# Patient Record
Sex: Female | Born: 1970 | Race: Black or African American | Hispanic: No | Marital: Married | Smoking: Never smoker
Health system: Southern US, Community
[De-identification: ages and names within clinical notes are randomized; demographics above are authoritative.]

## PROBLEM LIST (undated history)

## (undated) HISTORY — PX: ABDOMINAL HYSTERECTOMY: SHX81

## (undated) HISTORY — PX: TUBAL LIGATION: SHX77

## (undated) HISTORY — PX: OTHER SURGICAL HISTORY: SHX169

---

## 2004-04-26 ENCOUNTER — Inpatient Hospital Stay (HOSPITAL_COMMUNITY): Admission: AD | Admit: 2004-04-26 | Discharge: 2004-04-26 | Payer: Self-pay | Admitting: *Deleted

## 2004-05-24 ENCOUNTER — Ambulatory Visit: Payer: Self-pay | Admitting: *Deleted

## 2004-05-24 ENCOUNTER — Encounter: Admission: RE | Admit: 2004-05-24 | Discharge: 2004-05-24 | Payer: Self-pay | Admitting: *Deleted

## 2004-06-14 ENCOUNTER — Ambulatory Visit (HOSPITAL_COMMUNITY): Admission: RE | Admit: 2004-06-14 | Discharge: 2004-06-14 | Payer: Self-pay | Admitting: Obstetrics and Gynecology

## 2004-06-14 ENCOUNTER — Encounter: Admission: RE | Admit: 2004-06-14 | Discharge: 2004-06-14 | Payer: Self-pay | Admitting: *Deleted

## 2004-07-05 ENCOUNTER — Encounter: Admission: RE | Admit: 2004-07-05 | Discharge: 2004-07-05 | Payer: Self-pay | Admitting: *Deleted

## 2004-07-07 ENCOUNTER — Ambulatory Visit (HOSPITAL_COMMUNITY): Admission: RE | Admit: 2004-07-07 | Discharge: 2004-07-07 | Payer: Self-pay | Admitting: *Deleted

## 2004-07-19 ENCOUNTER — Encounter: Admission: RE | Admit: 2004-07-19 | Discharge: 2004-07-19 | Payer: Self-pay | Admitting: *Deleted

## 2004-07-20 ENCOUNTER — Ambulatory Visit (HOSPITAL_COMMUNITY): Admission: RE | Admit: 2004-07-20 | Discharge: 2004-07-20 | Payer: Self-pay | Admitting: *Deleted

## 2004-08-02 ENCOUNTER — Encounter: Admission: RE | Admit: 2004-08-02 | Discharge: 2004-08-02 | Payer: Self-pay | Admitting: *Deleted

## 2004-08-02 ENCOUNTER — Ambulatory Visit (HOSPITAL_COMMUNITY): Admission: RE | Admit: 2004-08-02 | Discharge: 2004-08-02 | Payer: Self-pay | Admitting: *Deleted

## 2004-08-12 ENCOUNTER — Ambulatory Visit: Payer: Self-pay | Admitting: Family Medicine

## 2004-08-12 ENCOUNTER — Inpatient Hospital Stay (HOSPITAL_COMMUNITY): Admission: AD | Admit: 2004-08-12 | Discharge: 2004-08-12 | Payer: Self-pay | Admitting: Gynecology

## 2004-08-25 ENCOUNTER — Ambulatory Visit: Payer: Self-pay | Admitting: Family Medicine

## 2004-09-22 ENCOUNTER — Inpatient Hospital Stay (HOSPITAL_COMMUNITY): Admission: AD | Admit: 2004-09-22 | Discharge: 2004-09-22 | Payer: Self-pay | Admitting: Obstetrics & Gynecology

## 2004-09-27 ENCOUNTER — Ambulatory Visit: Payer: Self-pay | Admitting: *Deleted

## 2004-09-29 ENCOUNTER — Inpatient Hospital Stay (HOSPITAL_COMMUNITY): Admission: AD | Admit: 2004-09-29 | Discharge: 2004-10-02 | Payer: Self-pay | Admitting: *Deleted

## 2004-09-29 ENCOUNTER — Ambulatory Visit: Payer: Self-pay | Admitting: Family Medicine

## 2004-10-05 ENCOUNTER — Inpatient Hospital Stay (HOSPITAL_COMMUNITY): Admission: AD | Admit: 2004-10-05 | Discharge: 2004-10-05 | Payer: Self-pay | Admitting: Obstetrics and Gynecology

## 2004-10-10 ENCOUNTER — Inpatient Hospital Stay (HOSPITAL_COMMUNITY): Admission: AD | Admit: 2004-10-10 | Discharge: 2004-10-10 | Payer: Self-pay | Admitting: Obstetrics and Gynecology

## 2005-09-28 IMAGING — US US OB LIMITED
1 series · 14 of 28 positions shown · non-contrast
Comparison: none

CLINICAL DATA: Reevaluate left fetal renal pyelectasis.

[Series 1: us ob limited · 0.33mm/px · 29 acquisitions, 14 frames shown]
[im 2/29]
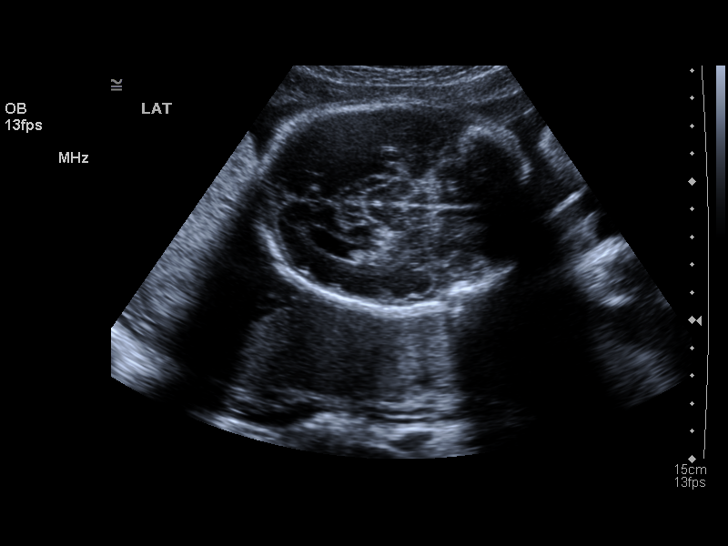
[im 4/29]
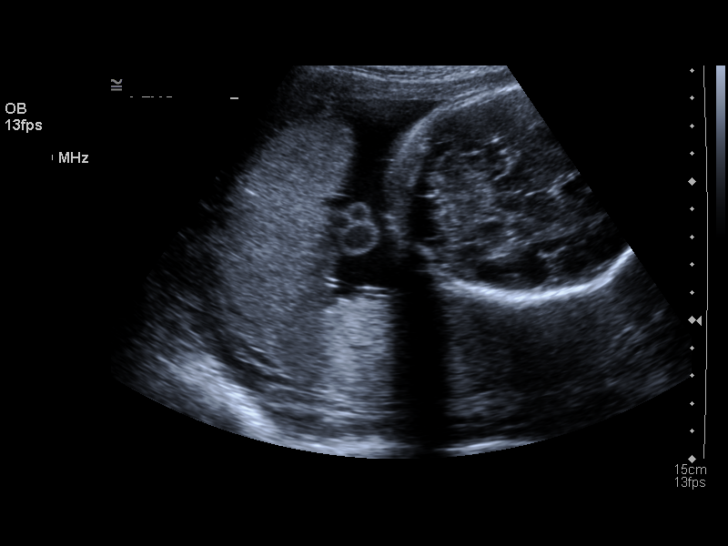
[im 6/29]
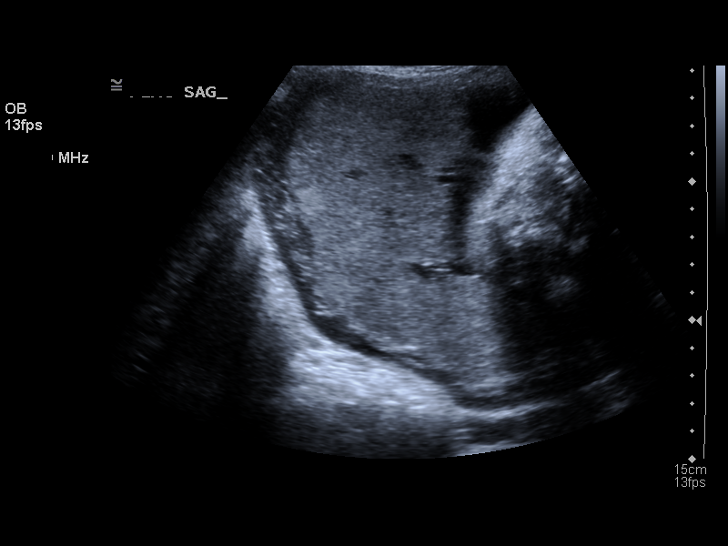
[im 8/29]
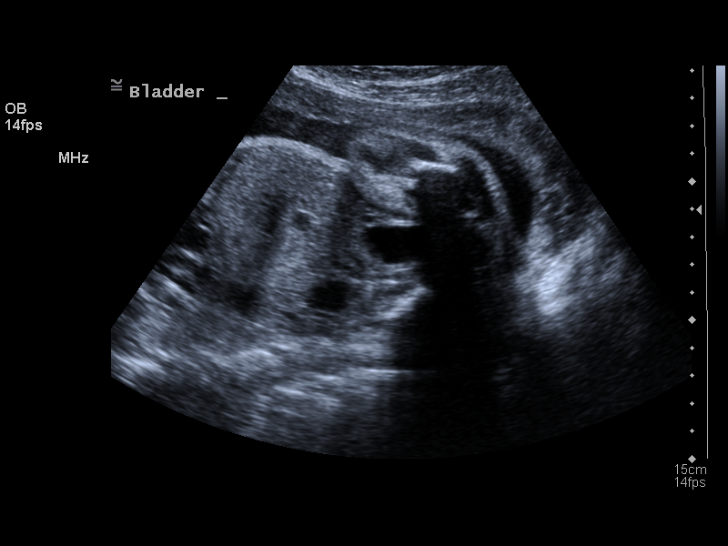
[im 10/29]
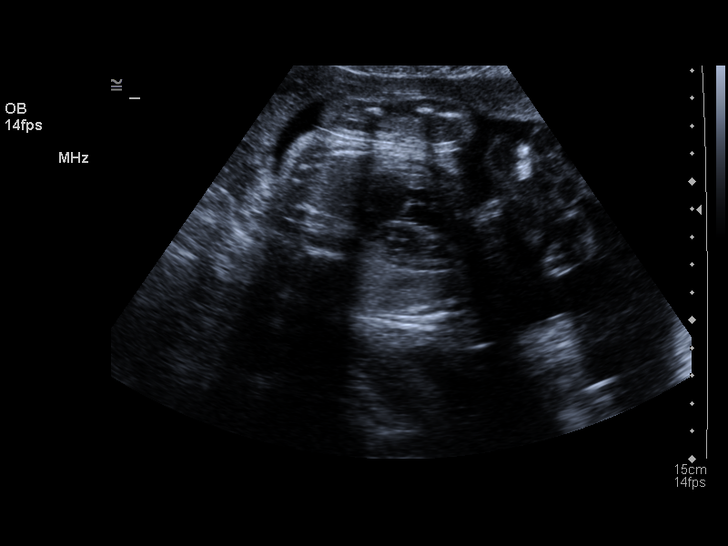
[im 12/29]
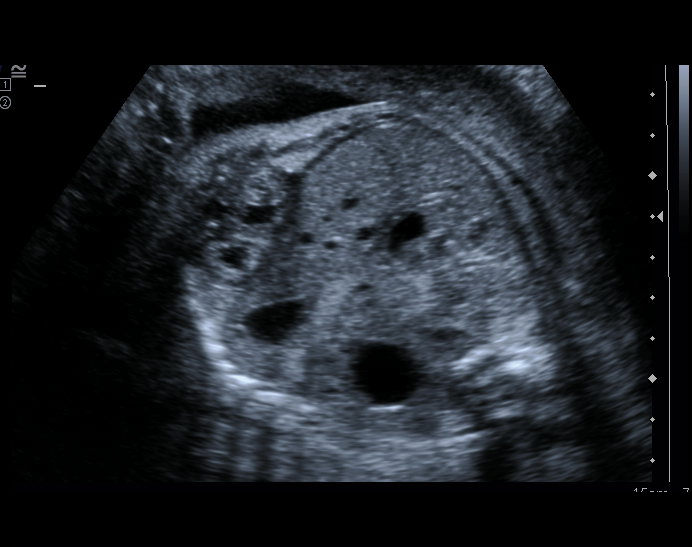
[im 14/29]
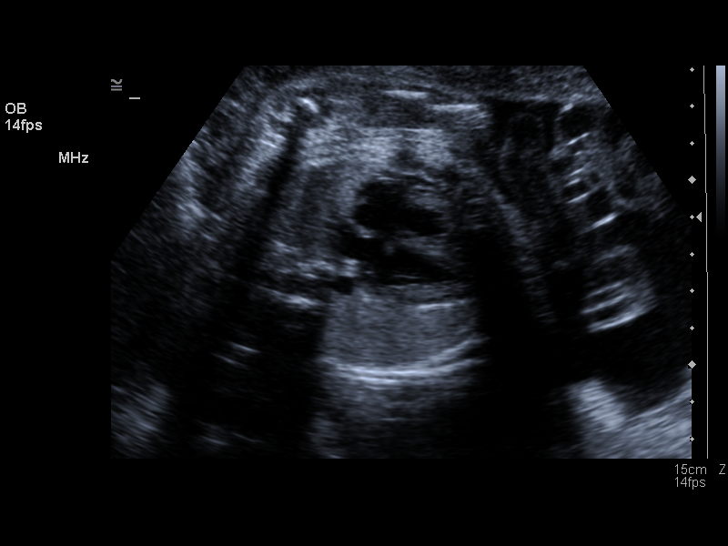
[im 16/29]
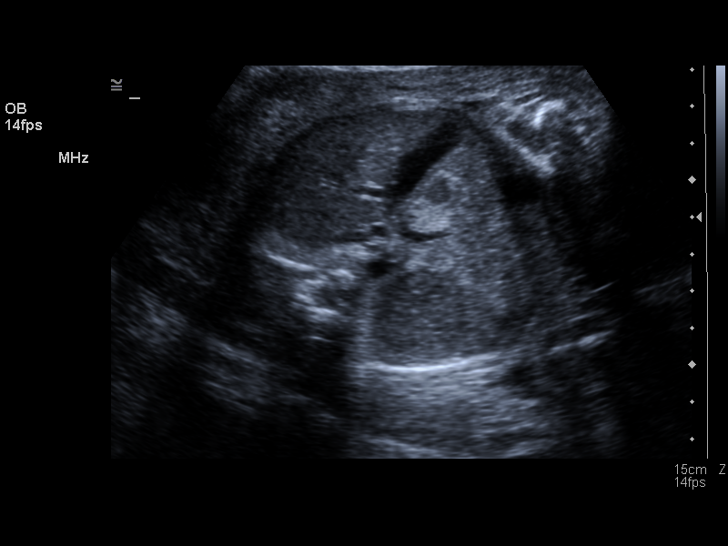
[im 18/29]
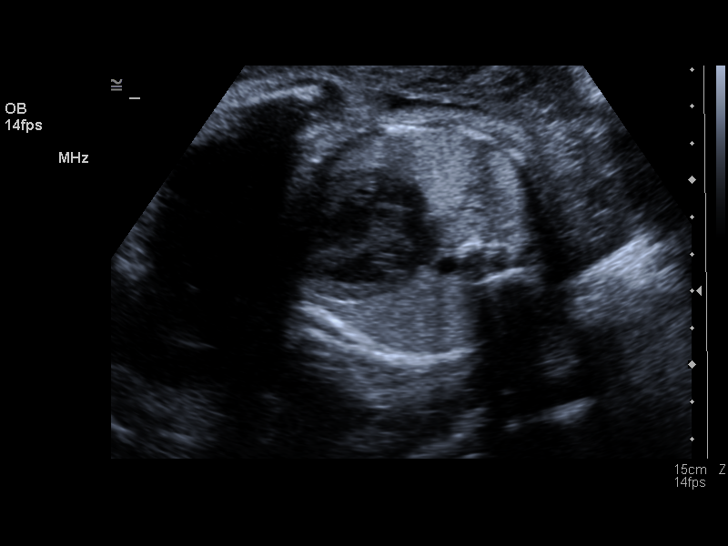
[im 20/29]
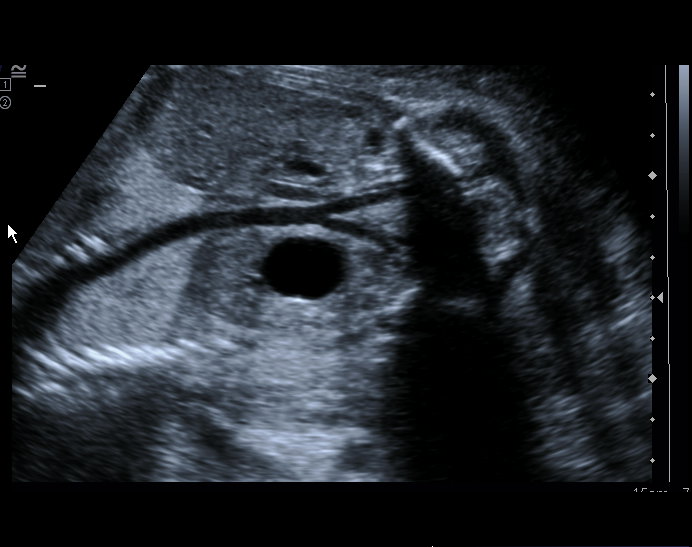
[im 22/29]
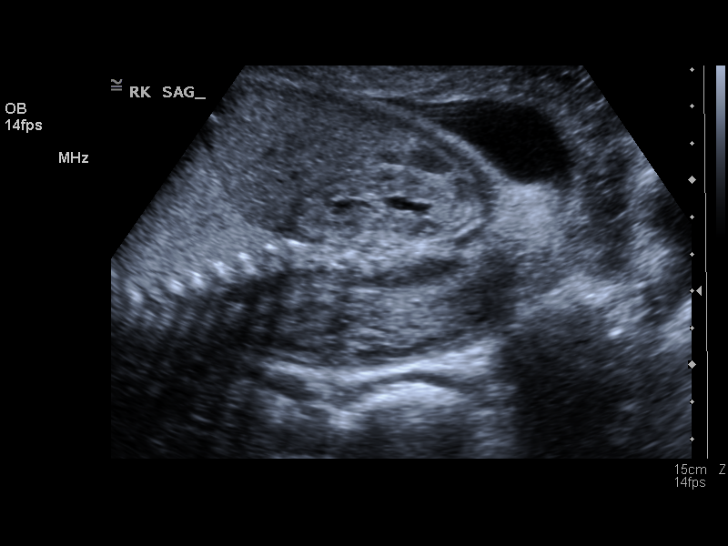
[im 24/29]
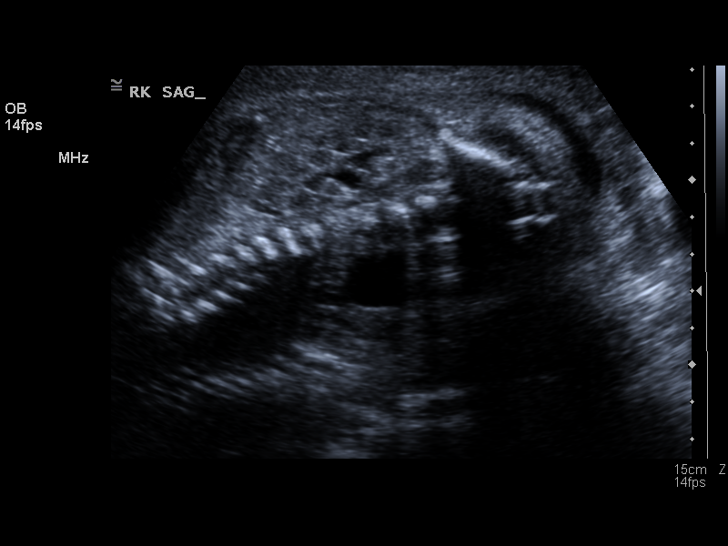
[im 26/29]
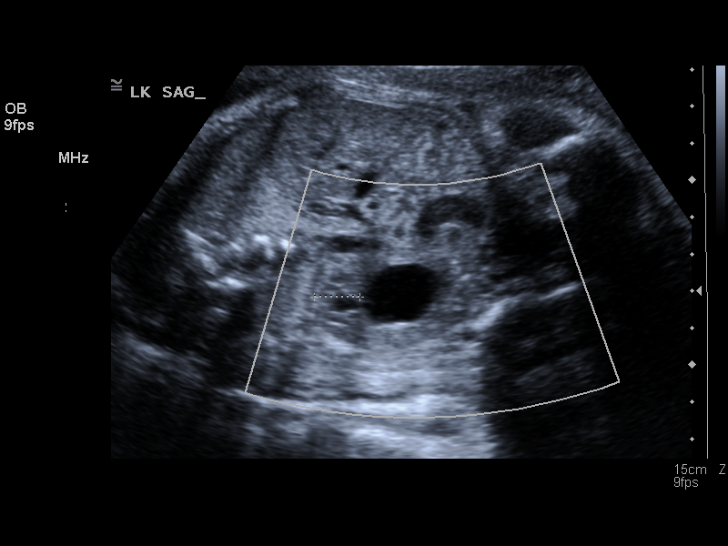
[im 29/29]
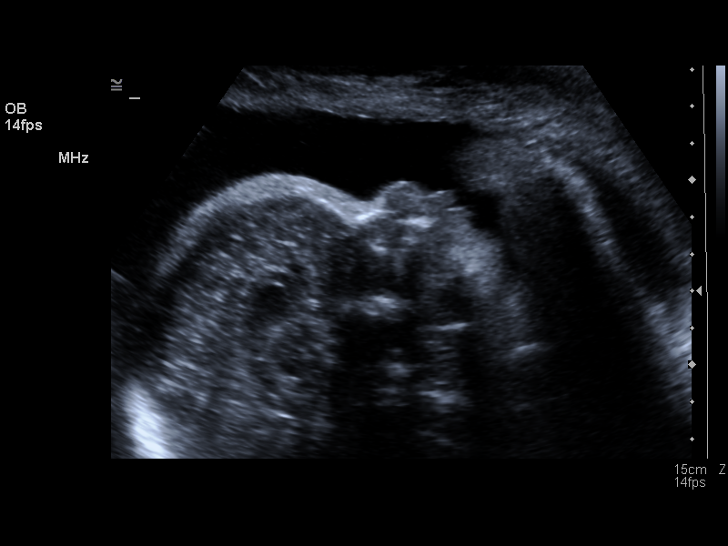

[14 of 28 positions shown; findings below may reference images not displayed]

LIMITED OBSTETRICAL ULTRASOUND
 Number of Fetuses:  1
 Heart Rate:  141
 Movement:  Yes
 Breathing:  Yes
 Presentation:  Transverse with head to maternal right
 Placental Location:  Fundal, anterior 
 Grade:  I
 Previa:  No
 Amniotic Fluid (Subjective):  Normal
 Amniotic Fluid (Objective):  16.2 cm AFI (5th -95th%ile = 9.2 ? 23.1 cm for 29 wks)

 Fetal measurements and complete anatomic evaluation were not requested.  The following fetal anatomy was visualized during this exam:  Lateral ventricles, stomach, kidneys, and bladder.

 MATERNAL FINDINGS
 Cervix:  4.3 cm Transabdominally
IMPRESSION: Single living intrauterine fetus in transverse presentation with subjectively and quantitatively normal amniotic fluid volume.  
 Persistent left fetal renal pyelectasis measuring up to 19 mm in AP diameter for the left renal pelvis  This is stable since the previous exam.  There is evidence of caliectasis on the left.  The right renal pelvis measures within normal limits in AP diameter for a 29 week gestation.  Features remain compatible with a unilateral UPJ obstruction as described in the previous study.

## 2005-11-09 ENCOUNTER — Inpatient Hospital Stay (HOSPITAL_COMMUNITY): Admission: AD | Admit: 2005-11-09 | Discharge: 2005-11-09 | Payer: Self-pay | Admitting: Obstetrics and Gynecology

## 2005-12-29 ENCOUNTER — Ambulatory Visit: Payer: Self-pay | Admitting: Certified Nurse Midwife

## 2005-12-29 ENCOUNTER — Inpatient Hospital Stay (HOSPITAL_COMMUNITY): Admission: AD | Admit: 2005-12-29 | Discharge: 2006-01-03 | Payer: Self-pay | Admitting: *Deleted

## 2006-01-04 ENCOUNTER — Ambulatory Visit: Payer: Self-pay | Admitting: Family Medicine

## 2006-01-04 ENCOUNTER — Inpatient Hospital Stay (HOSPITAL_COMMUNITY): Admission: AD | Admit: 2006-01-04 | Discharge: 2006-01-04 | Payer: Self-pay | Admitting: Obstetrics & Gynecology

## 2006-01-08 ENCOUNTER — Ambulatory Visit (HOSPITAL_COMMUNITY): Admission: RE | Admit: 2006-01-08 | Discharge: 2006-01-08 | Payer: Self-pay | Admitting: Obstetrics & Gynecology

## 2006-01-08 ENCOUNTER — Ambulatory Visit: Payer: Self-pay | Admitting: Obstetrics & Gynecology

## 2006-01-11 ENCOUNTER — Ambulatory Visit: Payer: Self-pay | Admitting: Family Medicine

## 2006-01-11 ENCOUNTER — Inpatient Hospital Stay (HOSPITAL_COMMUNITY): Admission: AD | Admit: 2006-01-11 | Discharge: 2006-01-11 | Payer: Self-pay | Admitting: Family Medicine

## 2006-01-15 ENCOUNTER — Ambulatory Visit: Payer: Self-pay | Admitting: Obstetrics and Gynecology

## 2006-01-15 ENCOUNTER — Ambulatory Visit (HOSPITAL_COMMUNITY): Admission: RE | Admit: 2006-01-15 | Discharge: 2006-01-15 | Payer: Self-pay | Admitting: *Deleted

## 2006-01-18 ENCOUNTER — Ambulatory Visit: Payer: Self-pay | Admitting: Family Medicine

## 2006-01-20 ENCOUNTER — Ambulatory Visit: Payer: Self-pay | Admitting: *Deleted

## 2006-01-20 ENCOUNTER — Inpatient Hospital Stay (HOSPITAL_COMMUNITY): Admission: AD | Admit: 2006-01-20 | Discharge: 2006-01-23 | Payer: Self-pay | Admitting: *Deleted

## 2006-01-20 ENCOUNTER — Encounter (INDEPENDENT_AMBULATORY_CARE_PROVIDER_SITE_OTHER): Payer: Self-pay | Admitting: *Deleted

## 2006-02-15 ENCOUNTER — Ambulatory Visit: Payer: Self-pay | Admitting: Obstetrics and Gynecology

## 2006-06-26 ENCOUNTER — Emergency Department (HOSPITAL_COMMUNITY): Admission: EM | Admit: 2006-06-26 | Discharge: 2006-06-26 | Payer: Self-pay | Admitting: Emergency Medicine

## 2007-03-09 IMAGING — US US OB COMP +14 WK
1 series · 13 of 28 positions shown · non-contrast
Comparison: none

CLINICAL DATA: 26 week 4 day gestational age by LMP.  Heavy vaginal bleeding and pelvic pain.  No prenatal care.

[Series 1: us ob comp +14 wk · 0.39mm/px · 13 of 53 slices shown]
[im 2/53]
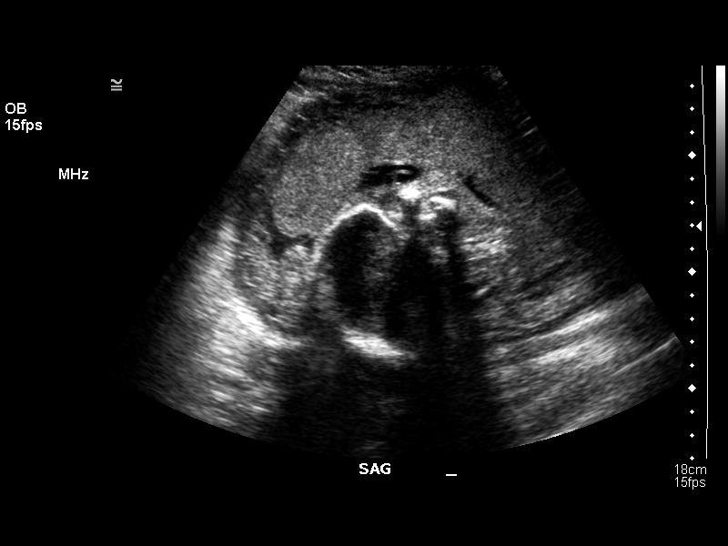
[im 6/53]
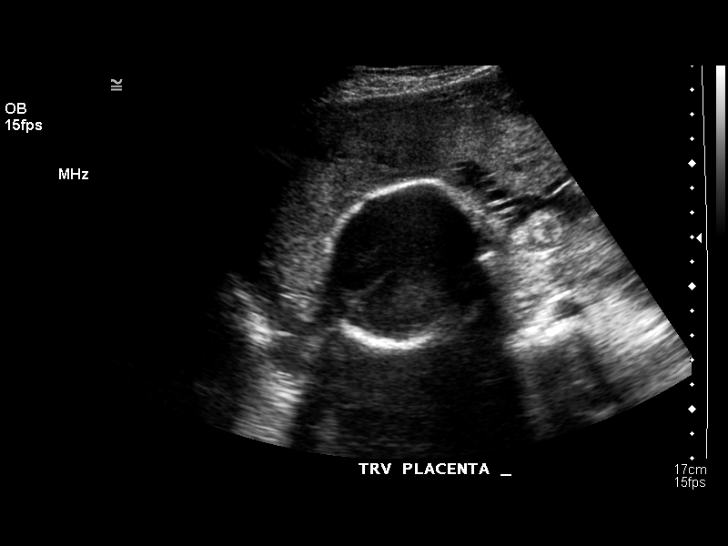
[im 10/53]
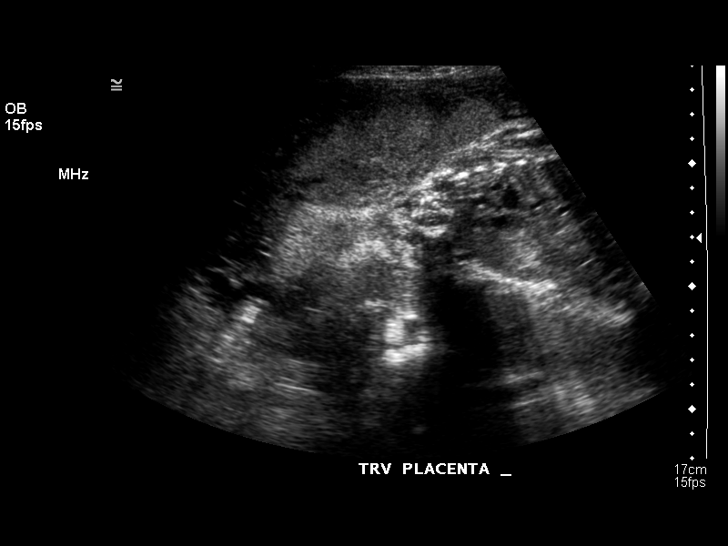
[im 14/53]
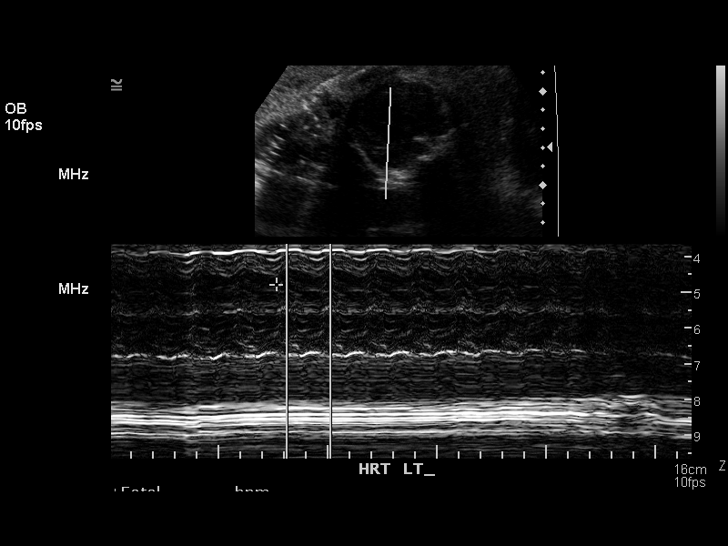
[im 18/53]
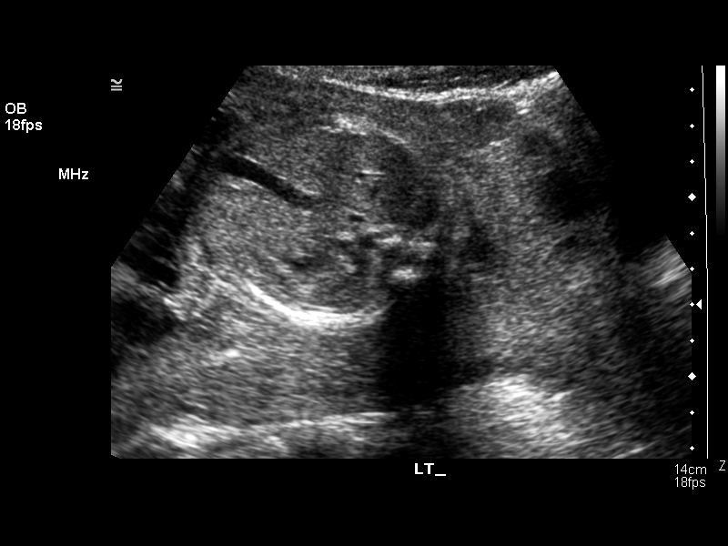
[im 22/53]
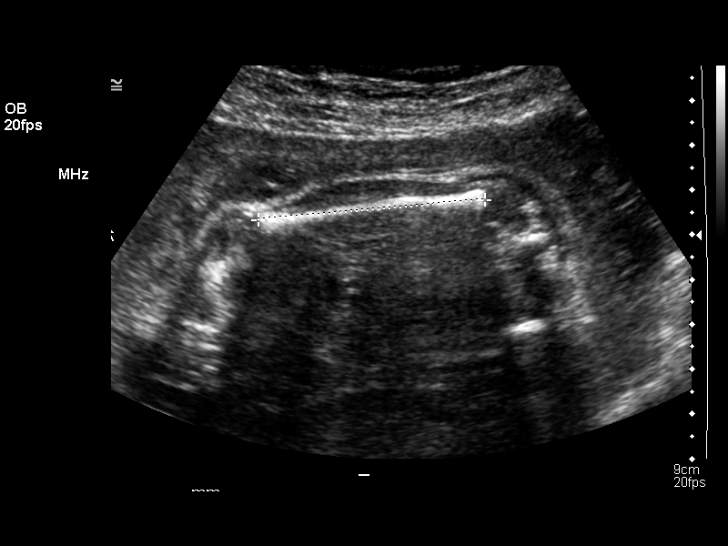
[im 27/53]
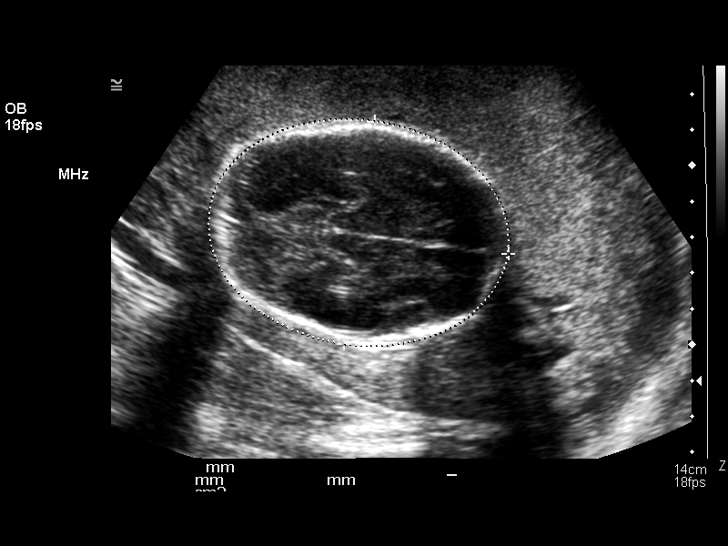
[im 31/53]
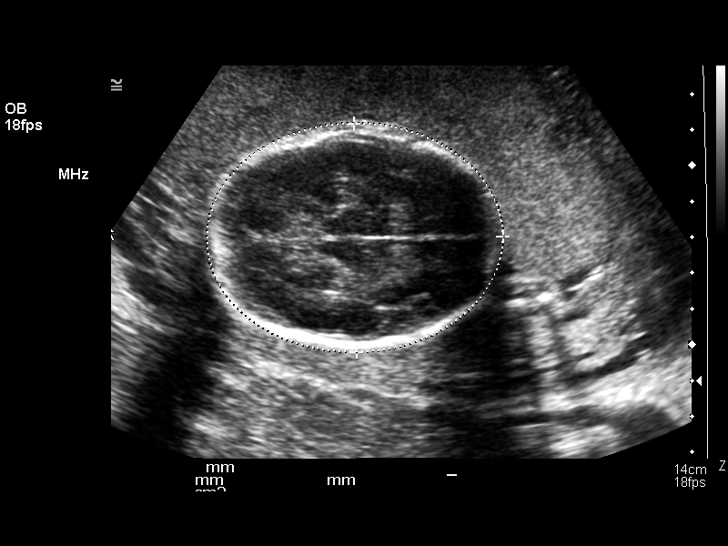
[im 35/53]
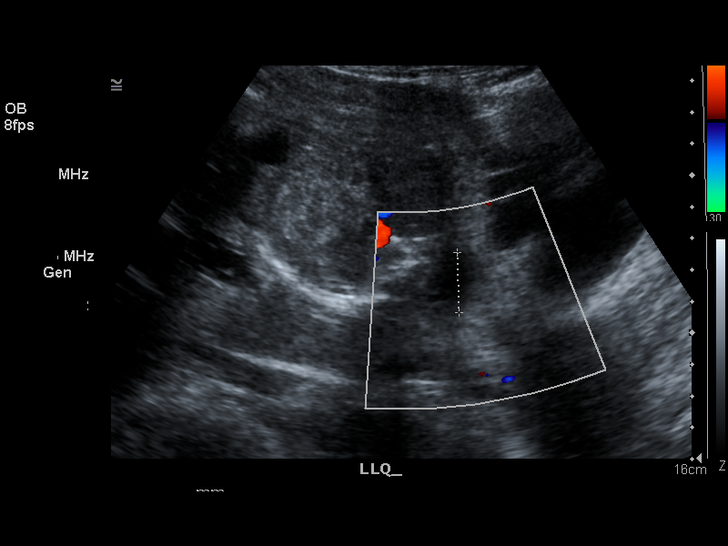
[im 39/53]
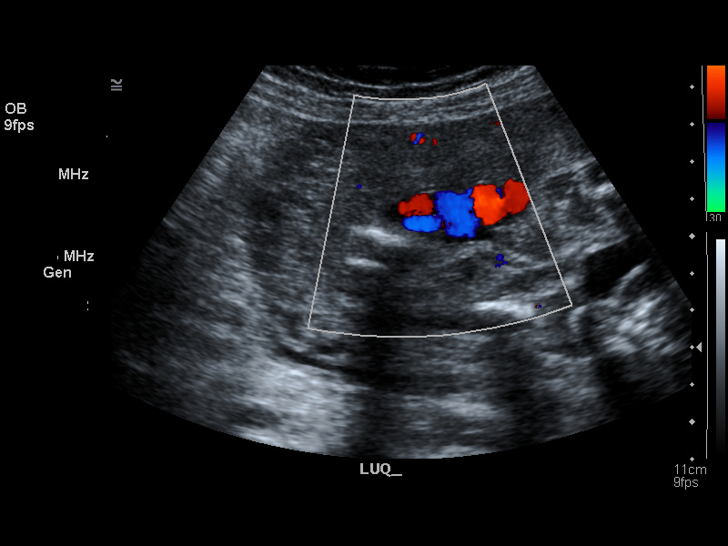
[im 43/53]
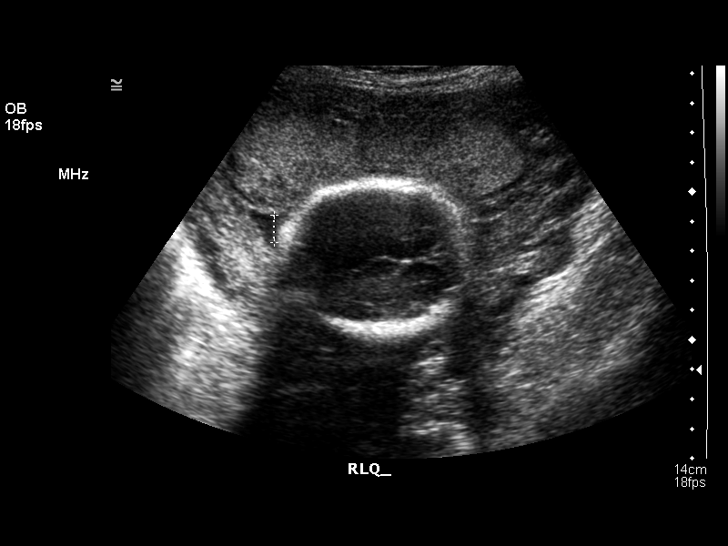
[im 47/53]
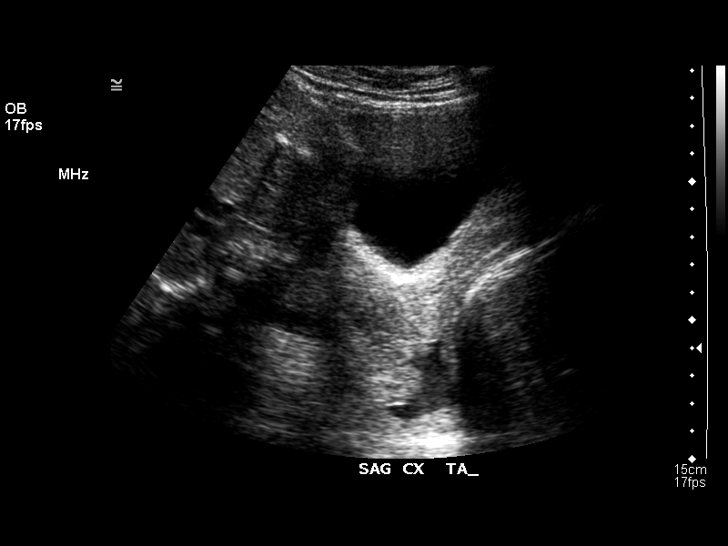
[im 51/53]
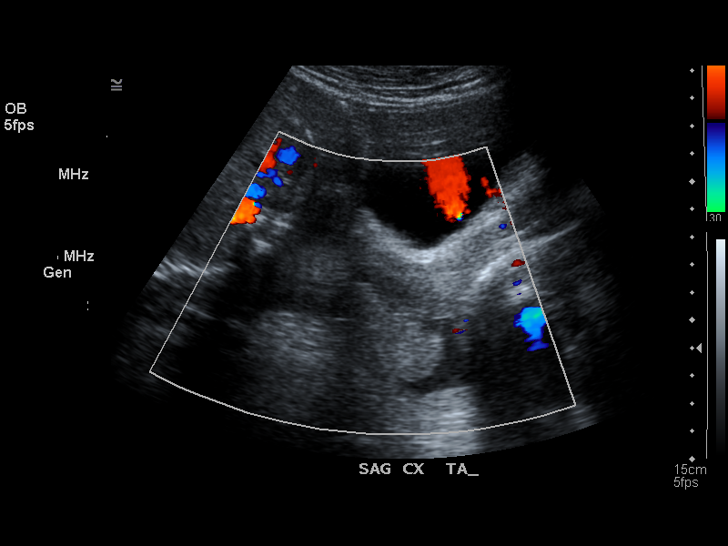

[13 of 28 positions shown; findings below may reference images not displayed]

OBSTETRICAL ULTRASOUND:
 Number of Fetuses:  1
 Heart Rate:  150
 Movement:  Yes
 Breathing:  No  
 Presentation:  Transverse with head to maternal right
 Placental Location:  Anterior
 Grade:  I
 Previa:  No
 Amniotic Fluid (Subjective):  Decreased
 Amniotic Fluid (Objective):   6.1 cm AFI (5th -95th%ile = 9.5 ? 22.6 cm for 27 wks)

 FETAL BIOMETRY
 BPD:   6.0 cm   24 w 5 d
 HC:   23.2 cm   25 w 2 d
 AC:   20.8 cm  25 w 3 d
 FL:    5.0 cm  27 w 1 d

 MEAN GA:  25 w 5 d  US EDC:  04/08/06

 FETAL ANATOMY
 Lateral Ventricles:    Not visualized 
 Thalami/CSP:      Visualized 
 Posterior Fossa:  Not visualized 
 Nuchal Region:    Not visualized 
 Spine:      Not visualized 
 4 Chamber Heart on Left:      Not visualized 
 Stomach on Left:      Visualized 
 3 Vessel Cord:    Not visualized 
 Cord Insertion site:    Not visualized 
 Kidneys:  Visualized 
 Bladder:  Visualized 
 Extremities:      Not visualized 

 ADDITIONAL ANATOMY VISUALIZED:  Orbits.

 Evaluation limited by:  Fetal position and decreased amniotic fluid.

 MATERNAL UTERINE AND ADNEXAL FINDINGS
 Cervix: 4.2 cm Transabdominally
IMPRESSION: 1.  Single living intrauterine fetus with mean gestational age of 25 weeks 5 days and sonographic EDC of 04/08/06.  This is within one week of stated LMP.
 2.  Decreased amniotic fluid volume, with AFI of 6.1 cm.  
 3.  No placental abruption or previa identified.  Limited anatomic evaluation due to decreased amniotic fluid, however no definite fetal abnormality identified.

## 2007-05-12 ENCOUNTER — Emergency Department (HOSPITAL_COMMUNITY): Admission: EM | Admit: 2007-05-12 | Discharge: 2007-05-12 | Payer: Self-pay | Admitting: Emergency Medicine

## 2011-01-15 ENCOUNTER — Encounter: Payer: Self-pay | Admitting: *Deleted

## 2011-05-12 NOTE — Discharge Summary (Signed)
[name], [name]               ACCOUNT NO.:  [id number]   MEDICAL RECORD NO.:  [id number]          PATIENT TYPE:  WOC   LOCATION:  WOC                          FACILITY:  [facility]   PHYSICIAN:  Barth Kirks, M.D.  DATE OF BIRTH:  27-Jan-1971   DATE OF ADMISSION:  01/18/2006  DATE OF DISCHARGE:  01/23/2006                                 DISCHARGE SUMMARY   DISCHARGE DIAGNOSES:  1.  Low-transverse cesarean section of a 29 week and 6/7 days gestation of a      female delivered by cesarean section.  2.  Premature preterm and prolonged rupture of the membranes.  3.  Footling breech presentation of the infant.  4.  Bilateral tubal ligation and desire for permanent sterilization.   DISCHARGE MEDICATIONS:  1.  Percocet 5/325 one tablet by mouth q.6h. for pain.  2.  Ibuprofen 600 mg one tablet q.6h. for pain.  3.  Vistaril 25 mg one tablet every six hours for itching.  4.  Iron sulfate 325 mg one tablet twice daily in between meals for anemia.  5.  Colace 100 mg one tablet daily as a stool softener.   BRIEF HOSPITAL COURSE:  The patient is a 40 year old gravida 8, para 4-0-2-  4, who presented at 84 weeks and 6/7 days best estimated gestational age,  came complaining of pain in back.  She had been having contractions and she  has had a previous prolonged rupture of her membranes with a hospitalization  approximately one month ago.  The patient presented with vaginal bleeding  and with fetal activity.  The patient was noted to have blood in her toilet  and pain in her lower back and she came on in to the MAU.  The patient was  given betamethasone x2 back on her earlier January admission.  She also is  known to have gestational diabetes, as well as a history of pyelonephritis  and depression.  Her obstetrical history is significant for four vaginal  delivery, two D&Cs, one ectopic at 9 weeks and had an abortion, as well as a  tubal ligation then a reversal in 2004 and now with a tubal  ligation again  for the second time.  It was noted in the MAU that the patient had a dilated  cervix with footling breech presentation with legs coming out of the cervix.  The baseline rate was at 145.  Her last  AFI was 1.1.  The patient was taken  back to the OR for emergent C-section.  Please see the dictated OP note on  January 20, 2006 for further details.  The patient delivered a female fetus,  who is currently in the NICU, with Apgars of 4 and 8.  The patient went on  to have a curettage of her uterus, as well as a bitubal ligation.  This was  all performed under spinal anesthesia.  The patient was transported to the  postpartum floor, where she was observed and continued to have a full  recovery status post surgery.  The patient did well, had intake of good oral  solids, was  able to move and her pain was well controlled and her bleeding  was very minimal.  The patient was noted to have a fever on January 29 at  1625 up to 100.5.  The patient was watched for 24 hours and was discharged  home.  The patient is tolerating p.o. well and is now ready for discharge.  The patient will follow up in [facility] in six weeks.  The patient was  noted to have chronic anemia and was sent home on iron twice daily, as well  as itching also known as PUPPS.  The patient was sent home  with Vistaril to take as needed for itching.  The patient has a tubal  ligation for sterilization.  The patient is bottle feeding.  RPR was  nonreactive, hepatitis B surface antigen was negative and rubella was  immune.  Blood type was B positive and antibody negative.      Barth Kirks, M.D.     MB/MEDQ  D:  01/23/2006  T:  01/23/2006  Job:  [id number]

## 2011-05-12 NOTE — Op Note (Signed)
NAMEKATHYANN, [name]               ACCOUNT NO.:  [id number]   MEDICAL RECORD NO.:  [id number]          PATIENT TYPE:  INP   LOCATION:                                [facility]   PHYSICIAN:  Conni Elliot, M.D.DATE OF BIRTH:  October 12, 1971   DATE OF PROCEDURE:  01/20/2006  DATE OF DISCHARGE:  01/23/2006                                 OPERATIVE REPORT   PREOPERATIVE DIAGNOSES:  1.  A 29-week 6-day intrauterine pregnancy.  2.  Active labor.  3.  Prolonged preterm premature rupture of membranes at 26 weeks 5 days of      pregnancy.  4.  Desire for permanent sterilization.  5.  Footling breech.   POSTOPERATIVE DIAGNOSES:  1.  A 29-week 6-day intrauterine pregnancy.  2.  Active labor.  3.  Prolonged preterm premature rupture of membranes at 26 weeks 5 days of      pregnancy.  4.  Desire for permanent sterilization.  5.  Footling breech.   OPERATION/PROCEDURE:  1.  Primary low transverse cesarean section via Pfannenstiel skin incision.  2.  Banjo curettage of uterine cavity.  3.  Bilateral tubal ligation.   SURGEON:  Conni Elliot, M.D.   ASSISTANT:  Marc Morgans. Mayford Knife, M.D.   ANESTHESIA:  Spinal.   COMPLICATIONS:  None.   ESTIMATED BLOOD LOSS:  800.   IV FLUIDS:  2000 mL of lactated Ringer's.   URINARY OUTPUT:  200 mL of clear urine.   INDICATIONS:  The patient is a 40 year old, gravida 4, para 4-0-3-4  at 29  weeks 6 days who presented complaining of contractions and vaginal pressure.  The patient's pregnancy had been complicated by prolonged preterm premature  rupture of membranes on December 29, 2005, when she was only 26 weeks 5 days  pregnant.  She was hospitalized January 5 through January 10 but left AMA.  The had been non-compliant with therapy.  Her last AFI was 1.9 and baby had  been cephalic.  However, on __________ at MAU, exam revealed that the baby  was footling breech and she was dilated to 8 cm and in active labor. The  patient was counseled on  the risks and benefits of surgery and she wanted to  proceed.   FINDINGS:  Female infant in the footling breech presentation with Apgars of 4  and 8.  Normal and ovaries. She does have scarring of bilateral tubes  consistent with her history of having a tubal reversal.   DESCRIPTION OF PROCEDURE:  The patient was taken to the operating room where  spinal anesthesia was placed and found to be adequate.  She was then prepped  and draped in the normal sterile fashion in the dorsal supine position with  a leftward tilt.  A Pfannenstiel skin incision was then made with the  scalpel and carried through to the underlying layer of fascia.  The fascia  was nicked in the midline and the incision extended laterally with the Mayo  scissors.  The superior aspect of the fascial incision was then grasped with  the Kocher clamps, elevated and the underlying  rectus muscles dissected off  bluntly.  The rectus muscles were then separated in the midline and the  peritoneum identified and entered bluntly using the hemostats.  The  peritoneal incision was extended superiorly and inferiorly with good  visualization of the bladder.   The bladder blade was then inserted and the vesicouterine peritoneum  identified, grasped with the pickups and entered sharply with the Metzenbaum  scissors.  The incision was extended laterally and the bladder flap created  digitally.  The bladder blade was then reinserted and the lower uterine  segment incised in a transverse fashion with the scalpel.  The uterine  incision was extended laterally with the bandage scissors.  The bladder  blade was removed and the infant's bottom was delivered, then his legs  followed by his left arm, then right arm, and finally head.  This was all  done atraumatically.  The nose and mouth were bulb-suctioned and the cord  clamped and cut.  The infant was handed off to the waiting neonatologist.  Cord gases were sent.   The placenta was then  removed manually, the uterus exteriorized.  There was  a significant amount of necrotic tissue. We were unable to clear all of it  using a lap and so a banjo curettage was also used.  The placenta was sent.  The uterine incision was repaired with #1 chromic in a running locked  fashion.  A second layer of the same suture was used to imbricate.  There  was excellent hemostasis.  The uterus was replaced and the gutters  irrigated.  The bladder flap was repaired with 2-0 chromic in a running  stitch.  The gutters were cleared of all clot and debris.   Attention was then turned to the fallopian tubes.  The right fallopian tube  was grasped with a Babcock. Then a 2-0 SH chromic was used to ligate a  portion of the right fallopian tube. Then a second piece of the same suture  was used to ligate just inferior to where it was initially ligated.  That  portion of the tube was excised and sent to pathology.  Attention was then  turned to the left side and the same thing was done to the left side.   The peritoneum was closed with 2-0 chromic in a running fashion.  Fascia was  closed with 2-0 plain.  Skin was closed with staples.   The patient tolerated the procedure well.  Sponge, lap and needle counts  were correct x2.  Cefoxitin was given at cord clamp.  The patient was taken  to the recovery room in stable condition.     ______________________________  Marc Morgans Mayford Knife, M.D.    ______________________________  Conni Elliot, M.D.    TLW/MEDQ  D:  01/20/2006  T:  01/21/2006  Job:  [id number]
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
## 2015-05-31 ENCOUNTER — Encounter (HOSPITAL_BASED_OUTPATIENT_CLINIC_OR_DEPARTMENT_OTHER): Payer: Self-pay | Admitting: *Deleted

## 2015-05-31 ENCOUNTER — Emergency Department (HOSPITAL_BASED_OUTPATIENT_CLINIC_OR_DEPARTMENT_OTHER): Payer: Medicaid Other

## 2015-05-31 ENCOUNTER — Emergency Department (HOSPITAL_BASED_OUTPATIENT_CLINIC_OR_DEPARTMENT_OTHER)
Admission: EM | Admit: 2015-05-31 | Discharge: 2015-05-31 | Disposition: A | Discharge status: Home / Self Care | Payer: Medicaid Other | Attending: Emergency Medicine | Admitting: Emergency Medicine

## 2015-05-31 DIAGNOSIS — R1031 Right lower quadrant pain: Secondary | ICD-10-CM | POA: Insufficient documentation

## 2015-05-31 DIAGNOSIS — R1110 Vomiting, unspecified: Secondary | ICD-10-CM | POA: Diagnosis not present

## 2015-05-31 DIAGNOSIS — R109 Unspecified abdominal pain: Secondary | ICD-10-CM

## 2015-05-31 DIAGNOSIS — Z9851 Tubal ligation status: Secondary | ICD-10-CM | POA: Insufficient documentation

## 2015-05-31 DIAGNOSIS — N949 Unspecified condition associated with female genital organs and menstrual cycle: Secondary | ICD-10-CM

## 2015-05-31 DIAGNOSIS — Z9889 Other specified postprocedural states: Secondary | ICD-10-CM | POA: Diagnosis not present

## 2015-05-31 DIAGNOSIS — Z3202 Encounter for pregnancy test, result negative: Secondary | ICD-10-CM | POA: Insufficient documentation

## 2015-05-31 LAB — URINALYSIS, ROUTINE W REFLEX MICROSCOPIC
BILIRUBIN URINE: NEGATIVE
Glucose, UA: NEGATIVE mg/dL
KETONES UR: NEGATIVE mg/dL
NITRITE: NEGATIVE
PROTEIN: NEGATIVE mg/dL
Specific Gravity, Urine: 1.021 (ref 1.005–1.030)
Urobilinogen, UA: 1 mg/dL (ref 0.0–1.0)
pH: 7.5 (ref 5.0–8.0)

## 2015-05-31 LAB — URINE MICROSCOPIC-ADD ON

## 2015-05-31 LAB — COMPREHENSIVE METABOLIC PANEL
ALT: 11 U/L — ABNORMAL LOW (ref 14–54)
AST: 14 U/L — ABNORMAL LOW (ref 15–41)
Albumin: 4.1 g/dL (ref 3.5–5.0)
Alkaline Phosphatase: 49 U/L (ref 38–126)
Anion gap: 8 (ref 5–15)
BUN: 6 mg/dL (ref 6–20)
CALCIUM: 9.9 mg/dL (ref 8.9–10.3)
CO2: 21 mmol/L — AB (ref 22–32)
Chloride: 110 mmol/L (ref 101–111)
Creatinine, Ser: 0.54 mg/dL (ref 0.44–1.00)
GFR calc non Af Amer: >60 mL/min (ref >60)
GLUCOSE: 103 mg/dL — AB (ref 65–99)
Potassium: 3.7 mmol/L (ref 3.5–5.1)
Sodium: 139 mmol/L (ref 135–145)
Total Bilirubin: 0.5 mg/dL (ref 0.3–1.2)
Total Protein: 7.5 g/dL (ref 6.5–8.1)

## 2015-05-31 LAB — CBC WITH DIFFERENTIAL/PLATELET
Basophils Absolute: 0 10*3/uL (ref 0.0–0.1)
Basophils Relative: 0 % (ref 0–1)
EOS PCT: 1 % (ref 0–5)
Eosinophils Absolute: 0.1 10*3/uL (ref 0.0–0.7)
HEMATOCRIT: 41 % (ref 36.0–46.0)
Hemoglobin: 13.7 g/dL (ref 12.0–15.0)
LYMPHS ABS: 1.2 10*3/uL (ref 0.7–4.0)
Lymphocytes Relative: 10 % — ABNORMAL LOW (ref 12–46)
MCH: 29.3 pg (ref 26.0–34.0)
MCHC: 33.4 g/dL (ref 30.0–36.0)
MCV: 87.8 fL (ref 78.0–100.0)
MONO ABS: 0.6 10*3/uL (ref 0.1–1.0)
Monocytes Relative: 4 % (ref 3–12)
NEUTROS PCT: 85 % — AB (ref 43–77)
Neutro Abs: 10.7 10*3/uL — ABNORMAL HIGH (ref 1.7–7.7)
Platelets: 229 10*3/uL (ref 150–400)
RBC: 4.67 MIL/uL (ref 3.87–5.11)
RDW: 13.7 % (ref 11.5–15.5)
WBC: 12.6 10*3/uL — ABNORMAL HIGH (ref 4.0–10.5)

## 2015-05-31 LAB — LIPASE, BLOOD: LIPASE: 12 U/L — AB (ref 22–51)

## 2015-05-31 LAB — PREGNANCY, URINE: PREG TEST UR: NEGATIVE

## 2015-05-31 MED ORDER — ONDANSETRON HCL 4 MG PO TABS: 4.0000 mg | ORAL_TABLET | Freq: Three times a day (TID) | ORAL | Status: DC | PRN

## 2015-05-31 MED ORDER — MORPHINE SULFATE 4 MG/ML IJ SOLN
4.0000 mg | Freq: Once | INTRAMUSCULAR | Status: AC
  Administered 2015-05-31: 4 mg via INTRAVENOUS
  Filled 2015-05-31: qty 1

## 2015-05-31 MED ORDER — LIDOCAINE HCL (PF) 1 % IJ SOLN
INTRAMUSCULAR | Status: AC
  Administered 2015-05-31: 5 mL
  Filled 2015-05-31: qty 5

## 2015-05-31 MED ORDER — DOXYCYCLINE HYCLATE 50 MG PO CAPS: 100.0000 mg | ORAL_CAPSULE | Freq: Two times a day (BID) | ORAL | Status: DC

## 2015-05-31 MED ORDER — MORPHINE SULFATE 2 MG/ML IJ SOLN
2.0000 mg | Freq: Once | INTRAMUSCULAR | Status: AC
  Administered 2015-05-31: 2 mg via INTRAVENOUS
  Filled 2015-05-31: qty 1

## 2015-05-31 MED ORDER — CEFTRIAXONE SODIUM 250 MG IJ SOLR
250.0000 mg | Freq: Once | INTRAMUSCULAR | Status: AC
  Administered 2015-05-31: 250 mg via INTRAMUSCULAR
  Filled 2015-05-31: qty 250

## 2015-05-31 MED ORDER — HYDROCODONE-ACETAMINOPHEN 5-325 MG PO TABS: 1.0000 | ORAL_TABLET | Freq: Four times a day (QID) | ORAL | Status: DC | PRN

## 2015-05-31 MED ORDER — METRONIDAZOLE 500 MG PO TABS: 500.0000 mg | ORAL_TABLET | Freq: Two times a day (BID) | ORAL | Status: DC

## 2015-05-31 NOTE — ED Notes (Signed)
Abdominal pain since last night. Hx of same pain when she was diagnosed with trichomonas.

## 2015-05-31 NOTE — ED Notes (Signed)
Per Seward GraterMaggie, lab tech, previous specimens collected need to be recollected due to "clot" in the specimen.

## 2015-05-31 NOTE — Discharge Instructions (Signed)

## 2015-05-31 NOTE — ED Notes (Addendum)
Pt resting quietly in nad. Denies any c/o or needs, talking with family at bedside.

## 2015-05-31 NOTE — ED Provider Notes (Signed)
CSN: [id number]     Arrival date & time 05/31/15  1127 History   First MD Initiated Contact with Patient 05/31/15 1212     Chief Complaint  Patient presents with  . Abdominal Pain   RLQ abdominal pain that started last night. Sharp in nature and 9-10/10.  No radiation and feels like contractions.  The pain is constant and getting worse. She had one episode of vomiting last night.  It is similar to the pain she had when she had trichomonas last year. She never completed the ABX for that treatment. It is possible that she has an STD as her an her partner never completed the treatment and continue to have unprotected intercourse.  Pain is worse with movement.  She took some tylenol with codeine and that helped the pain some. Last bowel movement was this morning and normal.  Pain is not related to eating.  History of c-section and tubal ligation in 2007 and an ablation in 2015.    (Consider location/radiation/quality/duration/timing/severity/associated sxs/prior Treatment) Patient is a 44 y.o. female presenting with abdominal pain. The history is provided by the patient.  Abdominal Pain Pain location:  RLQ Pain quality: sharp   Pain radiates to:  Does not radiate Pain severity:  Severe Onset quality:  Sudden Timing:  Constant Progression:  Worsening Chronicity:  New Context: alcohol use   Worsened by:  Movement Associated symptoms: vomiting   Associated symptoms: no chest pain, no chills, no constipation, no cough, no diarrhea, no dysuria, no fever, no nausea and no shortness of breath     History reviewed. No pertinent past medical history. Past Surgical History  Procedure Laterality Date  . Tubal ligation    . Cesarean section    . Ablasion     No family history on file. History  Substance Use Topics  . Smoking status: Never Smoker   . Smokeless tobacco: Not on file  . Alcohol Use: No   OB History    No data available     Review of Systems  Constitutional: Negative for  fever, chills and appetite change.  HENT: Negative for trouble swallowing.   Respiratory: Negative for cough, choking and shortness of breath.   Cardiovascular: Negative for chest pain.  Gastrointestinal: Positive for vomiting and abdominal pain. Negative for nausea, diarrhea, constipation and blood in stool.  Genitourinary: Negative for dysuria and urgency.  Musculoskeletal: Negative for back pain and arthralgias.  Skin: Negative for color change and rash.  Psychiatric/Behavioral: Negative for confusion.      Allergies  Review of patient's allergies indicates no known allergies.  Home Medications   Prior to Admission medications   Not on File   BP 131/65 mmHg  Pulse 64  Temp(Src) 98.2 F (36.8 C) (Oral)  Resp 20  Ht  (1.702 m)  Wt 165 lb (74.844 kg)  BMI 25.84 kg/m2  SpO2 98%  LMP 05/31/2015 (Exact Date) Physical Exam  Constitutional: She is oriented to person, place, and time. She appears well-developed and well-nourished.  HENT:  Head: Normocephalic and atraumatic.  Eyes: Conjunctivae and EOM are normal. Pupils are equal, round, and reactive to light.  Neck: Normal range of motion. Neck supple. No thyromegaly present.  Cardiovascular: Normal rate, regular rhythm, normal heart sounds and intact distal pulses.   No murmur heard. Pulmonary/Chest: Effort normal. She has no wheezes. She has no rales.  Abdominal: Soft. Bowel sounds are normal. She exhibits no distension. There is tenderness. There is no rebound and no guarding.  Genitourinary: Vagina normal.  No external genitalia  Unable to visualize cervix Cervical motion tenderness Tenderness of the right adnexa with palpation    Musculoskeletal: She exhibits no edema.  Lymphadenopathy:    She has no cervical adenopathy.  Neurological: She is alert and oriented to person, place, and time.  Skin: Skin is warm. No rash noted. No erythema.    ED Course  Procedures (including critical care time) Labs  Review Labs Reviewed  URINALYSIS, ROUTINE W REFLEX MICROSCOPIC (NOT AT [facility]) - Abnormal; Notable for the following:    APPearance TURBID (*)    Hgb urine dipstick SMALL (*)    Leukocytes, UA SMALL (*)    All other components within normal limits  URINE MICROSCOPIC-ADD ON - Abnormal; Notable for the following:    Bacteria, UA MANY (*)    All other components within normal limits  CBC WITH DIFFERENTIAL/PLATELET - Abnormal; Notable for the following:    WBC 12.6 (*)    Neutrophils Relative % 85 (*)    Neutro Abs 10.7 (*)    Lymphocytes Relative 10 (*)    All other components within normal limits  LIPASE, BLOOD - Abnormal; Notable for the following:    Lipase 12 (*)    All other components within normal limits  COMPREHENSIVE METABOLIC PANEL - Abnormal; Notable for the following:    CO2 21 (*)    Glucose, Bld 103 (*)    AST 14 (*)    ALT 11 (*)    All other components within normal limits  URINE CULTURE  PREGNANCY, URINE  CBC WITH DIFFERENTIAL/PLATELET  HIV ANTIBODY (ROUTINE TESTING)  RPR  URINE CYTOLOGY ANCILLARY ONLY    Imaging Review US Transvaginal Non-ob  05/31/2015   CLINICAL DATA:  44 year old female with right pelvic pain for 1 day. Patient began menstrual cycle today. History of previous C-section and bilateral tubal ligation.  EXAM: TRANSABDOMINAL AND TRANSVAGINAL ULTRASOUND OF PELVIS  TECHNIQUE: Both transabdominal and transvaginal ultrasound examinations of the pelvis were performed. Transabdominal technique was performed for global imaging of the pelvis including uterus, ovaries, adnexal regions, and pelvic cul-de-sac. It was necessary to proceed with endovaginal exam following the transabdominal exam to visualize the endometrium and adnexal regions.  COMPARISON:  02/14/2011 and prior exams.  FINDINGS: Uterus  Measurements: Anteverted measuring 9.7 x 4.8 x 6 cm. No fibroids or other mass visualized. A septate uterus is noted.  Endometrium  Thickness: 4 mm. Complex fluid/  blood within the endometrial canal identified.  Right ovary  Measurements: Unremarkable measuring 2.9 x 1.7 x 2.5 cm. Normal appearance/no adnexal mass.  Left ovary  Measurements: Unremarkable measuring 3 x 2.5 x 2.1 cm. Normal appearance/no adnexal mass.  Other findings  No free fluid.  IMPRESSION: Complex fluid/ blood within the endometrial canal likely representing beginning of menses.  Septate uterus.  Normal ovaries.   Electronically Signed   By: Harmon Pier M.D.   On: 05/31/2015 15:52   US Pelvis Complete  05/31/2015   CLINICAL DATA:  44 year old female with right pelvic pain for 1 day. Patient began menstrual cycle today. History of previous C-section and bilateral tubal ligation.  EXAM: TRANSABDOMINAL AND TRANSVAGINAL ULTRASOUND OF PELVIS  TECHNIQUE: Both transabdominal and transvaginal ultrasound examinations of the pelvis were performed. Transabdominal technique was performed for global imaging of the pelvis including uterus, ovaries, adnexal regions, and pelvic cul-de-sac. It was necessary to proceed with endovaginal exam following the transabdominal exam to visualize the endometrium and adnexal regions.  COMPARISON:  02/14/2011  and prior exams.  FINDINGS: Uterus  Measurements: Anteverted measuring 9.7 x 4.8 x 6 cm. No fibroids or other mass visualized. A septate uterus is noted.  Endometrium  Thickness: 4 mm. Complex fluid/ blood within the endometrial canal identified.  Right ovary  Measurements: Unremarkable measuring 2.9 x 1.7 x 2.5 cm. Normal appearance/no adnexal mass.  Left ovary  Measurements: Unremarkable measuring 3 x 2.5 x 2.1 cm. Normal appearance/no adnexal mass.  Other findings  No free fluid.  IMPRESSION: Complex fluid/ blood within the endometrial canal likely representing beginning of menses.  Septate uterus.  Normal ovaries.   Electronically Signed   By: Harmon PierJeffrey  Hu M.D.   On: 05/31/2015 15:52     EKG Interpretation None     Medications  cefTRIAXone (ROCEPHIN) injection 250 mg  (not administered)  morphine 2 MG/ML injection 2 mg (2 mg Intravenous Given 05/31/15 1247)  morphine 4 MG/ML injection 4 mg (4 mg Intravenous Given 05/31/15 1342)  morphine 4 MG/ML injection 4 mg (4 mg Intravenous Given 05/31/15 1538)    MDM   Final diagnoses:  Cervical motion tenderness  Abdominal pain    Abdominal pain in the RLQ. Urine pregnancy negative. Doesn't appear to be appendicitis. May be ovarian in nature.  Order CMP, lipase, HIV, RPR, urine cytology and wet prep.   UA showing small leukocytes. Urine culture ordered. Unable to visualize cervix but tenderness with motion and adnexal pain with palpation upon palpation. Transvaginal US ordered. US not revealing.   Could be related to her mentration but will treat to cover for PID as she has a history of trich with incomplete treatment. CTX 250 mg IM, doxycycline 10 mg BID for 14 days and flagyl 500 mg BID for 14 days. Give norco for pain PRN and zofran for nausea.     Myra RudeJeremy E Schmitz, MD PGY-2, [facility] 05/31/2015, 4:15 PM       Myra RudeJeremy E Schmitz, MD 05/31/15 1615  Toy CookeyMegan Docherty, MD 06/01/15 [phone]

## 2015-05-31 NOTE — ED Notes (Signed)
Pt states "I just started my period, full on!" Pt given feminine napkin per request, amb to bathroom with s/b@.

## 2015-06-01 LAB — URINE CULTURE: Colony Count: >=100000

## 2015-06-01 LAB — RPR: RPR Ser Ql: NONREACTIVE

## 2015-06-01 LAB — HIV ANTIBODY (ROUTINE TESTING W REFLEX): HIV SCREEN 4TH GENERATION: NONREACTIVE

## 2015-06-18 ENCOUNTER — Emergency Department (HOSPITAL_BASED_OUTPATIENT_CLINIC_OR_DEPARTMENT_OTHER)
Admission: EM | Admit: 2015-06-18 | Discharge: 2015-06-18 | Disposition: A | Discharge status: Home / Self Care | Payer: Medicaid Other | Attending: Emergency Medicine | Admitting: Emergency Medicine

## 2015-06-18 ENCOUNTER — Encounter (HOSPITAL_BASED_OUTPATIENT_CLINIC_OR_DEPARTMENT_OTHER): Payer: Self-pay | Admitting: Emergency Medicine

## 2015-06-18 DIAGNOSIS — H6123 Impacted cerumen, bilateral: Secondary | ICD-10-CM | POA: Diagnosis not present

## 2015-06-18 DIAGNOSIS — Z792 Long term (current) use of antibiotics: Secondary | ICD-10-CM | POA: Diagnosis not present

## 2015-06-18 DIAGNOSIS — H6121 Impacted cerumen, right ear: Secondary | ICD-10-CM

## 2015-06-18 DIAGNOSIS — H938X1 Other specified disorders of right ear: Secondary | ICD-10-CM | POA: Diagnosis present

## 2015-06-18 MED ORDER — DOCUSATE SODIUM 50 MG/5ML PO LIQD
50.0000 mg | Freq: Once | ORAL | Status: AC
  Administered 2015-06-18: 50 mg via ORAL
  Filled 2015-06-18: qty 10

## 2015-06-18 NOTE — ED Provider Notes (Signed)
CSN: [id number]     Arrival date & time 06/18/15  1254 History   First MD Initiated Contact with Patient 06/18/15 1312     Chief Complaint  Patient presents with  . Cerumen Impaction     (Consider location/radiation/quality/duration/timing/severity/associated sxs/prior Treatment) The history is provided by the patient and medical records.   This is a 44 y.o. F with hx of tubal ligation, presenting to the ED for decreased hearing from her right eat x 2 days.  Patient states her ear feels "clogged".  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  She denies ear pain or drainage.  No problems with left ear.  History reviewed. No pertinent past medical history. Past Surgical History  Procedure Laterality Date  . Tubal ligation    . Cesarean section    . Ablasion     History reviewed. No pertinent family history. History  Substance Use Topics  . Smoking status: Never Smoker   . Smokeless tobacco: Not on file  . Alcohol Use: No   OB History    No data available     Review of Systems  HENT:       Cerumen impaction  All other systems reviewed and are negative.     Allergies  Review of patient's allergies indicates no known allergies.  Home Medications   Prior to Admission medications   Medication Sig Start Date End Date Taking? Authorizing Provider  doxycycline (VIBRAMYCIN) 50 MG capsule Take 2 capsules (100 mg total) by mouth 2 (two) times daily. 05/31/15   Myra Rude, MD  HYDROcodone-acetaminophen (NORCO/VICODIN) 5-325 MG per tablet Take 1 tablet by mouth every 6 (six) hours as needed for moderate pain. 05/31/15   Myra Rude, MD  metroNIDAZOLE (FLAGYL) 500 MG tablet Take 1 tablet (500 mg total) by mouth 2 (two) times daily. 05/31/15   Myra Rude, MD  ondansetron (ZOFRAN) 4 MG tablet Take 1 tablet (4 mg total) by mouth every 8 (eight) hours as needed for nausea or vomiting. 05/31/15   Myra Rude, MD   BP 98/84 mmHg  Pulse  58  Temp(Src) 98.6 F (37 C) (Oral)  Resp 18  Ht 5\' 7"  (1.702 m)  Wt 158 lb (71.668 kg)  BMI 24.74 kg/m2  SpO2 100%  LMP 05/31/2015   Physical Exam  Constitutional: She is oriented to person, place, and time. She appears well-developed and well-nourished. No distress.  HENT:  Head: Normocephalic and atraumatic.  Mouth/Throat: Oropharynx is clear and moist.  Right cerumen impaction Left ear with wax noted, however not completely occluded  Eyes: Conjunctivae and EOM are normal. Pupils are equal, round, and reactive to light.  Neck: Normal range of motion. Neck supple.  Cardiovascular: Normal rate, regular rhythm and normal heart sounds.   Pulmonary/Chest: Effort normal and breath sounds normal. No respiratory distress. She has no wheezes.  Musculoskeletal: Normal range of motion.  Neurological: She is alert and oriented to person, place, and time.  Skin: Skin is warm and dry. She is not diaphoretic.  Psychiatric: She has a normal mood and affect.  Nursing note and vitals reviewed.   ED Course  EAR CERUMEN REMOVAL Date/Time: 06/18/2015 2:06 PM Performed by: Garlon Hatchet Authorized by: Garlon Hatchet Consent: Verbal consent obtained. Risks and benefits: risks, benefits and alternatives were discussed Consent given by: patient Patient identity confirmed: verbally with patient Local anesthetic: none Location details: right ear Procedure type: irrigation Patient sedated:  no Patient tolerance: Patient tolerated the procedure well with no immediate complications Comments: Colace applied to right ear to soften wax, ear was copiously irrigated with return of large amount of brown ear wax.  Patient tolerated well, no complications.  States she can hear normally at this time.   (including critical care time) Labs Review Labs Reviewed - No data to display  Imaging Review No results found.   EKG Interpretation None      MDM   Final diagnoses:  Cerumen impaction, right    Cerumen was disimpacted from right ear, patient tolerated well. On re-check TM appears normal, no signs of perforation or infection. Patient discharged home in stable condition.   Garlon Hatchet, PA-C 06/18/15 1408  Gwyneth Sprout, MD 06/18/15 [phone]

## 2015-06-18 NOTE — ED Notes (Signed)
Pt in c/o decreased ability to hear from R ear x 2 days. Denies pain or drainage, upon assessment with scope there is large amount of cerumen in ear.

## 2015-06-18 NOTE — ED Notes (Signed)
Right ear irrigated, large amount of dark brown cerumen noted. Pt states "Oh my god, I can hear now!!" and hugs this rn.

## 2015-06-26 ENCOUNTER — Emergency Department (HOSPITAL_BASED_OUTPATIENT_CLINIC_OR_DEPARTMENT_OTHER)
Admission: EM | Admit: 2015-06-26 | Discharge: 2015-06-26 | Disposition: A | Discharge status: Home / Self Care | Payer: Medicaid Other | Attending: Emergency Medicine | Admitting: Emergency Medicine

## 2015-06-26 ENCOUNTER — Encounter (HOSPITAL_BASED_OUTPATIENT_CLINIC_OR_DEPARTMENT_OTHER): Payer: Self-pay | Admitting: *Deleted

## 2015-06-26 DIAGNOSIS — R1031 Right lower quadrant pain: Secondary | ICD-10-CM | POA: Diagnosis present

## 2015-06-26 DIAGNOSIS — N946 Dysmenorrhea, unspecified: Secondary | ICD-10-CM | POA: Insufficient documentation

## 2015-06-26 DIAGNOSIS — Z9851 Tubal ligation status: Secondary | ICD-10-CM | POA: Diagnosis not present

## 2015-06-26 DIAGNOSIS — Z3202 Encounter for pregnancy test, result negative: Secondary | ICD-10-CM | POA: Diagnosis not present

## 2015-06-26 DIAGNOSIS — Z792 Long term (current) use of antibiotics: Secondary | ICD-10-CM | POA: Insufficient documentation

## 2015-06-26 LAB — URINALYSIS, ROUTINE W REFLEX MICROSCOPIC
BILIRUBIN URINE: NEGATIVE
GLUCOSE, UA: NEGATIVE mg/dL
Hgb urine dipstick: NEGATIVE
KETONES UR: NEGATIVE mg/dL
NITRITE: NEGATIVE
PH: 5.5 (ref 5.0–8.0)
Protein, ur: NEGATIVE mg/dL
Specific Gravity, Urine: 1.015 (ref 1.005–1.030)
Urobilinogen, UA: 0.2 mg/dL (ref 0.0–1.0)

## 2015-06-26 LAB — PREGNANCY, URINE: PREG TEST UR: NEGATIVE

## 2015-06-26 LAB — URINE MICROSCOPIC-ADD ON

## 2015-06-26 MED ORDER — KETOROLAC TROMETHAMINE 60 MG/2ML IM SOLN
60.0000 mg | Freq: Once | INTRAMUSCULAR | Status: AC
  Administered 2015-06-26: 60 mg via INTRAMUSCULAR
  Filled 2015-06-26: qty 2

## 2015-06-26 MED ORDER — HYDROMORPHONE HCL 1 MG/ML IJ SOLN
2.0000 mg | Freq: Once | INTRAMUSCULAR | Status: AC
  Administered 2015-06-26: 2 mg via INTRAMUSCULAR
  Filled 2015-06-26: qty 2

## 2015-06-26 MED ORDER — ONDANSETRON 4 MG PO TBDP
4.0000 mg | ORAL_TABLET | Freq: Once | ORAL | Status: AC
  Administered 2015-06-26: 4 mg via ORAL
  Filled 2015-06-26: qty 1

## 2015-06-26 MED ORDER — DIPHENHYDRAMINE HCL 50 MG/ML IJ SOLN: 25.0000 mg | Freq: Once | INTRAMUSCULAR | Status: DC

## 2015-06-26 MED ORDER — OXYCODONE-ACETAMINOPHEN 5-325 MG PO TABS: 1.0000 | ORAL_TABLET | Freq: Four times a day (QID) | ORAL | Status: DC | PRN

## 2015-06-26 MED ORDER — DIPHENHYDRAMINE HCL 50 MG/ML IJ SOLN
25.0000 mg | INTRAMUSCULAR | Status: AC
  Administered 2015-06-26: 25 mg via INTRAMUSCULAR
  Filled 2015-06-26: qty 1

## 2015-06-26 NOTE — ED Notes (Signed)
Pt reports her "period cramps are unbearable" since yesterday. Pt has taken hydrocodone 1700, tylenol with codeine & motrin at 1900 for the pain without relief.

## 2015-06-26 NOTE — ED Notes (Addendum)
Pt states that she experiences severe menstrual cramps every month and this pain is the same type of cramping she gets every month right before her period.  Pt c/o pain to RLQ worst, described as menstrual cramps.  Pt h/o ovarian cysts and had one ablation procedure done last year in attempt to stop menstrual cycle, but was not effective.  Pt seen in this ED last month for same.

## 2015-06-26 NOTE — ED Notes (Signed)
In to discharge the pt, c/o itching all over. No hives or rash noted. Dr. Anitra LauthPlunkett made aware of the same, new orders for benadryl given.

## 2015-06-26 NOTE — ED Provider Notes (Signed)
CSN: [id number]     Arrival date & time 06/26/15  2030 History  This chart was scribed for Sheryl Sprout, MD by Octavia Heir, ED Scribe. This patient was seen in room MH06/MH06 and the patient's care was started at 9:47 PM.    Chief Complaint  Patient presents with  . Abdominal Pain      The history is provided by the patient. No language interpreter was used.   HPI Comments: [name] is a 43 y.o. female who presents to the Emergency Department complaining of gradual worsening abdominal period cramps onset yesterday. Pt notes every month her period cramps are "unbearable" every month and she is unable to stand. She has been taking oxycodone to alleviate the pain with no relief. She reports having an ablasion done but notes it did not work. She denies vaginal discharge, problems with urinary system, and back pain.   History reviewed. No pertinent past medical history. Past Surgical History  Procedure Laterality Date  . Tubal ligation    . Cesarean section    . Ablasion     No family history on file. History  Substance Use Topics  . Smoking status: Never Smoker   . Smokeless tobacco: Not on file  . Alcohol Use: No   OB History    No data available     Review of Systems  A complete 10 system review of systems was obtained and all systems are negative except as noted in the HPI and PMH.    Allergies  Review of patient's allergies indicates no known allergies.  Home Medications   Prior to Admission medications   Medication Sig Start Date End Date Taking? Authorizing Provider  HYDROcodone-acetaminophen (NORCO/VICODIN) 5-325 MG per tablet Take 1 tablet by mouth every 6 (six) hours as needed for moderate pain. 05/31/15  Yes Myra Rude, MD  doxycycline (VIBRAMYCIN) 50 MG capsule Take 2 capsules (100 mg total) by mouth 2 (two) times daily. 05/31/15   Myra Rude, MD  metroNIDAZOLE (FLAGYL) 500 MG tablet Take 1 tablet (500 mg total) by mouth 2 (two) times daily.  05/31/15   Myra Rude, MD  ondansetron (ZOFRAN) 4 MG tablet Take 1 tablet (4 mg total) by mouth every 8 (eight) hours as needed for nausea or vomiting. 05/31/15   Myra Rude, MD   Triage vitals: BP 112/73 mmHg  Pulse 73  Temp(Src) 98.3 F (36.8 C) (Oral)  Resp 18  Ht  (1.702 m)  Wt 160 lb (72.576 kg)  BMI 25.05 kg/m2  SpO2 100%  LMP 05/31/2015 Physical Exam  Constitutional: She is oriented to person, place, and time. She appears well-developed and well-nourished. No distress.  HENT:  Head: Normocephalic.  Eyes: Conjunctivae are normal. Pupils are equal, round, and reactive to light. No scleral icterus.  Neck: Normal range of motion. Neck supple. No thyromegaly present.  Cardiovascular: Normal rate and regular rhythm.  Exam reveals no gallop and no friction rub.   No murmur heard. Pulmonary/Chest: Effort normal and breath sounds normal. No respiratory distress. She has no wheezes. She has no rales.  Abdominal: Soft. Bowel sounds are normal. She exhibits no distension. There is no tenderness. There is no rebound and no guarding.  Pain in RLQ, no CVA tenderness  Musculoskeletal: Normal range of motion.  Neurological: She is alert and oriented to person, place, and time.  Skin: Skin is warm and dry. No rash noted.  Psychiatric: She has a normal mood and affect. Her behavior  is normal.  Nursing note and vitals reviewed.   ED Course  Procedures  DIAGNOSTIC STUDIES: Oxygen Saturation is 100% on RA, normal by my interpretation.  COORDINATION OF CARE:  9:48 PM Discussed treatment plan which includes pain medication, referral to the women's clinic with pt at bedside and pt agreed to plan.  Labs Review Labs Reviewed  URINALYSIS, ROUTINE W REFLEX MICROSCOPIC (NOT AT [facility]) - Abnormal; Notable for the following:    APPearance CLOUDY (*)    Leukocytes, UA TRACE (*)    All other components within normal limits  URINE MICROSCOPIC-ADD ON - Abnormal; Notable for the following:     Squamous Epithelial / LPF FEW (*)    Bacteria, UA MANY (*)    All other components within normal limits  PREGNANCY, URINE    Imaging Review No results found.   EKG Interpretation None      MDM   Final diagnoses:  Dysmenorrhea   Patient with a history of ongoing right lower quadrant pain just prior to her menstrual cycle. The pain has returned last night and she has no more pain medication. She has been seeing Dr. Shawnie Ponsorn but has not been able to control the pain.  An ablation was tried but the pain has continued. She was seen less than one month ago for the same exact pain with full workup including an ultrasound and cultures that were all negative.  Patient states today her pain is the same as it always is. Though suspicion for ovarian torsion, ovarian cyst, tubo-ovarian abscess. Feel that this is dysmenorrhea. Patient was treated for her pain and given follow-up for women's clinic  I personally performed the services described in this documentation, which was scribed in my presence.  The recorded information has been reviewed and considered.   Sheryl SproutWhitney Lillian Tigges, MD 06/26/15 [phone]

## 2015-06-26 NOTE — Discharge Instructions (Signed)

## 2015-07-07 ENCOUNTER — Encounter: Payer: Self-pay | Admitting: Obstetrics and Gynecology

## 2015-07-07 ENCOUNTER — Ambulatory Visit (INDEPENDENT_AMBULATORY_CARE_PROVIDER_SITE_OTHER): Payer: Medicaid Other | Admitting: Obstetrics and Gynecology

## 2015-07-07 VITALS — BP 130/69 | HR 64 | Ht 67.0 in | Wt 162.4 lb

## 2015-07-07 DIAGNOSIS — N946 Dysmenorrhea, unspecified: Secondary | ICD-10-CM | POA: Diagnosis not present

## 2015-07-07 DIAGNOSIS — N760 Acute vaginitis: Secondary | ICD-10-CM

## 2015-07-07 DIAGNOSIS — N882 Stricture and stenosis of cervix uteri: Secondary | ICD-10-CM

## 2015-07-07 MED ORDER — CLOBETASOL PROPIONATE 0.05 % EX OINT: 1.0000 "application " | TOPICAL_OINTMENT | Freq: Two times a day (BID) | CUTANEOUS | Status: DC

## 2015-07-07 MED ORDER — MEDROXYPROGESTERONE ACETATE 150 MG/ML IM SUSP
150.0000 mg | INTRAMUSCULAR | Status: AC
  Administered 2015-07-07: 150 mg via INTRAMUSCULAR

## 2015-07-07 NOTE — Progress Notes (Signed)
   Subjective:    Patient ID: [name], female    DOB: 01-Dec-1971, 44 y.o.   MRN: [id number]  HPI 44 yo [id number] with LMP 05/31/2015 and BMI who presents today for the evaluation of dysmenorrhea. Patient reports onset of symptoms a few months after her endometrial ablation which was done in 2015. She also describes daily vaginal bleeding cycling from heavy to spotting. Her dysmenorrhea worsens on the heavier flow days. Patient has been seen by Dr. Shawnie Ponsorn and feels that she is not receiving adequate care. She is also complaining of vaginal pruritis and reports several testing with negative yeast infections. She denies any abnormal discharge currently. Patient is sexually active using BTL for contraception. She is not concerned about STD  History reviewed. No pertinent past medical history. Past Surgical History  Procedure Laterality Date  . Tubal ligation    . Cesarean section    . Ablasion     History reviewed. No pertinent family history. History  Substance Use Topics  . Smoking status: Never Smoker   . Smokeless tobacco: Not on file  . Alcohol Use: No      Review of Systems See pertinent in HPI  Blood pressure 130/69, pulse 64, height 5\' 7"  (1.702 m), weight 162 lb 6.4 oz (73.664 kg), last menstrual period 05/31/2015.     Objective:   Physical Exam GENERAL: Well-developed, well-nourished female in no acute distress.  ABDOMEN: Soft, nontender, nondistended. No organomegaly. PELVIC: Normal external female genitalia with areas of excoriations on labia majora bilaterally. Vagina is pink and rugated.  Normal discharge. Normal appearing cervix. Uterus is normal in size. No adnexal mass or tenderness. EXTREMITIES: No cyanosis, clubbing, or edema, 2+ distal pulses.   05/31/2015 Ultrasound FINDINGS: Uterus  Measurements: Anteverted measuring 9.7 x 4.8 x 6 cm. No fibroids or other mass visualized. A septate uterus is noted.  Endometrium  Thickness: 4 mm. Complex fluid/ blood  within the endometrial canal identified.  Right ovary  Measurements: Unremarkable measuring 2.9 x 1.7 x 2.5 cm. Normal appearance/no adnexal mass.  Left ovary  Measurements: Unremarkable measuring 3 x 2.5 x 2.1 cm. Normal appearance/no adnexal mass.  Other findings  No free fluid.  IMPRESSION: Complex fluid/ blood within the endometrial canal likely representing beginning of menses.  Septate uterus.  Normal ovaries.     Assessment & Plan:  44 yo with vaginitis, DUB and dysmenorrhea - Wet prep collected - Rx clobetasol ointment provided to apply to vulva - Patient will be contacted with any abnormal results - Discussed medical management with contraception. After reviewing the different options, patient opted for Mirena IUD  IUD Procedure Note Patient identified, informed consent performed, signed copy in chart, time out was performed.  Urine pregnancy test negative.  Speculum placed in the vagina.  Cervix visualized.  Cleaned with Betadine x 2.  Grasped anteriorly with a single tooth tenaculum.  Due to cervical stenosis of the internal os, it was not possible to sound the uterus or insert the IUD. The procedure was aborted  - Patient desires to have the IUD placed under anesthesia. She agrees to try depo-provera for 6 months and if she obtains good results, she will be scheduled for IUD insertion in the OR

## 2015-07-08 LAB — WET PREP, GENITAL
Trich, Wet Prep: NONE SEEN
Yeast Wet Prep HPF POC: NONE SEEN

## 2015-07-09 ENCOUNTER — Telehealth: Payer: Self-pay | Admitting: *Deleted

## 2015-07-09 MED ORDER — METRONIDAZOLE 500 MG PO TABS: 500.0000 mg | ORAL_TABLET | Freq: Two times a day (BID) | ORAL | Status: DC

## 2015-07-09 NOTE — Telephone Encounter (Addendum)
Called pt and informed her of test results showing +BV. I explained the condition completely. Rx sent to pharmacy per order from Dr. Jolayne Pantheronstant. Pt also stated that she has been taking Aleve 2 tablets twice daily and still having cramping. She wanted to know if she could get the Rx that Dr. Jolayne Pantheronstant had offered her for pain at the time of her visit.  I stated that I will send a message to Dr. Jolayne Pantheronstant but it will probably be Monday until I will receive a response. Meanwhile, pt may take tylenol between her doses of Aleve.  Pt voiced understanding of all information and instructions given.   7/18  1230  Called pt and informed her that Dr. Jolayne Pantheronstant has approved RX for Percocet. She may pick it up at our front office at her convenience. Pt voiced understanding and stated that she will pick up tomorrow.  Kiosha Buchan RNC

## 2015-07-12 ENCOUNTER — Other Ambulatory Visit: Payer: Self-pay | Admitting: Obstetrics and Gynecology

## 2015-07-12 MED ORDER — OXYCODONE-ACETAMINOPHEN 5-325 MG PO TABS: 1.0000 | ORAL_TABLET | ORAL | Status: DC | PRN

## 2015-09-03 ENCOUNTER — Ambulatory Visit: Payer: Medicaid Other | Admitting: Obstetrics and Gynecology

## 2015-10-06 ENCOUNTER — Ambulatory Visit: Payer: Medicaid Other

## 2016-03-10 ENCOUNTER — Encounter (HOSPITAL_BASED_OUTPATIENT_CLINIC_OR_DEPARTMENT_OTHER): Payer: Self-pay | Admitting: *Deleted

## 2016-03-10 ENCOUNTER — Emergency Department (HOSPITAL_BASED_OUTPATIENT_CLINIC_OR_DEPARTMENT_OTHER): Payer: Medicaid Other

## 2016-03-10 ENCOUNTER — Emergency Department (HOSPITAL_BASED_OUTPATIENT_CLINIC_OR_DEPARTMENT_OTHER)
Admission: EM | Admit: 2016-03-10 | Discharge: 2016-03-10 | Disposition: A | Discharge status: Home / Self Care | Payer: Medicaid Other | Attending: Emergency Medicine | Admitting: Emergency Medicine

## 2016-03-10 DIAGNOSIS — J111 Influenza due to unidentified influenza virus with other respiratory manifestations: Secondary | ICD-10-CM | POA: Diagnosis not present

## 2016-03-10 DIAGNOSIS — R69 Illness, unspecified: Secondary | ICD-10-CM

## 2016-03-10 DIAGNOSIS — R509 Fever, unspecified: Secondary | ICD-10-CM | POA: Diagnosis present

## 2016-03-10 MED ORDER — ACETAMINOPHEN 500 MG PO TABS
ORAL_TABLET | ORAL | Status: AC
  Administered 2016-03-10: 1000 mg via ORAL
  Filled 2016-03-10: qty 2

## 2016-03-10 MED ORDER — ACETAMINOPHEN 500 MG PO TABS
1000.0000 mg | ORAL_TABLET | Freq: Once | ORAL | Status: AC
  Administered 2016-03-10: 1000 mg via ORAL

## 2016-03-10 NOTE — ED Notes (Signed)
Pt reports fever x yesterday, body aches, chills, with cough.

## 2016-03-10 NOTE — Discharge Instructions (Signed)

## 2016-03-10 NOTE — ED Notes (Signed)
Patient transported to and from radiology department via stretcher. 

## 2016-03-10 NOTE — ED Provider Notes (Signed)
CSN: [id number]     Arrival date & time 03/10/16  0757 History   First MD Initiated Contact with Patient 03/10/16 0802     Chief Complaint  Patient presents with  . Fever    Patient is a 45 y.o. female presenting with fever. The history is provided by the patient.  Fever Associated symptoms: myalgias and sore throat   Patient presents with fever and myalgias and cough. Began yesterday. States she feels miserable. No real abdominal pain. Slight sore throat. No nausea vomiting or diarrhea. No headache. No confusion. No sick contacts. She does have a mildly decreased appetite. No dysuria. No production with the cough.  History reviewed. No pertinent past medical history. Past Surgical History  Procedure Laterality Date  . Tubal ligation    . Cesarean section    . Ablasion     History reviewed. No pertinent family history. Social History  Substance Use Topics  . Smoking status: Never Smoker   . Smokeless tobacco: None  . Alcohol Use: No   OB History    Gravida Para Term Preterm AB TAB SAB Ectopic Multiple Living   7 5 4 1 2  2   5      Review of Systems  Constitutional: Positive for fever, appetite change and fatigue.  HENT: Positive for sore throat. Negative for trouble swallowing.   Eyes: Negative for pain.  Respiratory: Negative for shortness of breath.   Gastrointestinal: Negative for abdominal pain.  Musculoskeletal: Positive for myalgias.  Skin: Negative for wound.      Allergies  Review of patient's allergies indicates no known allergies.  Home Medications   Prior to Admission medications   Not on File   BP 118/76 mmHg  Pulse 103  Temp(Src) 102.4 F (39.1 C) (Oral)  Resp 20  Ht 5\' 7"  (1.702 m)  Wt 176 lb (79.833 kg)  BMI 27.56 kg/m2  SpO2 100%  LMP 03/09/2016 Physical Exam  Constitutional: She is oriented to person, place, and time. She appears well-developed and well-nourished.  Patient appears uncomfortable  HENT:  Head: Normocephalic and  atraumatic.  Eyes: Pupils are equal, round, and reactive to light.  Neck: Normal range of motion. Neck supple.  Cardiovascular: Normal rate, regular rhythm and normal heart sounds.   No murmur heard. Pulmonary/Chest: Effort normal and breath sounds normal. No respiratory distress.  Abdominal: Soft. Bowel sounds are normal. She exhibits no distension.  Musculoskeletal: Normal range of motion.  Neurological: She is alert and oriented to person, place, and time.  Skin: Skin is warm and dry.  Psychiatric: Her speech is normal.  Nursing note and vitals reviewed.   ED Course  Procedures (including critical care time) Labs Review Labs Reviewed - No data to display  Imaging Review Dg Chest 2 View  03/10/2016  CLINICAL DATA:  Cough and fever for 2 days EXAM: CHEST  2 VIEW COMPARISON:  12/28/2007 FINDINGS: Normal heart size. Lungs clear. No pneumothorax. No pleural effusion. IMPRESSION: No active cardiopulmonary disease. Electronically Signed   By: Jolaine ClickArthur  Hoss M.D.   On: 03/10/2016 08:42   I have personally reviewed and evaluated these images and lab results as part of my medical decision-making.   EKG Interpretation None      MDM   Final diagnoses:  Influenza-like illness    Patient with fevers cough and feeling bad. Likely influenza-like illness. Negative x-ray. Throat is not appear to be strep. No nausea vomiting diarrhea or dysuria. Will discharge home and patient counseled on symptomatic treatment.  Benjiman Core, MD 03/10/16 [phone]

## 2016-05-21 ENCOUNTER — Encounter (HOSPITAL_BASED_OUTPATIENT_CLINIC_OR_DEPARTMENT_OTHER): Payer: Self-pay | Admitting: Emergency Medicine

## 2016-05-21 ENCOUNTER — Emergency Department (HOSPITAL_BASED_OUTPATIENT_CLINIC_OR_DEPARTMENT_OTHER)
Admission: EM | Admit: 2016-05-21 | Discharge: 2016-05-21 | Disposition: A | Discharge status: Home / Self Care | Payer: Medicaid Other | Attending: Emergency Medicine | Admitting: Emergency Medicine

## 2016-05-21 DIAGNOSIS — N946 Dysmenorrhea, unspecified: Secondary | ICD-10-CM | POA: Insufficient documentation

## 2016-05-21 DIAGNOSIS — R109 Unspecified abdominal pain: Secondary | ICD-10-CM | POA: Diagnosis present

## 2016-05-21 LAB — CBC WITH DIFFERENTIAL/PLATELET
Basophils Absolute: 0 10*3/uL (ref 0.0–0.1)
Basophils Relative: 0 %
EOS ABS: 0.4 10*3/uL (ref 0.0–0.7)
EOS PCT: 7 %
HCT: 39.8 % (ref 36.0–46.0)
Hemoglobin: 13.9 g/dL (ref 12.0–15.0)
LYMPHS ABS: 2 10*3/uL (ref 0.7–4.0)
LYMPHS PCT: 36 %
MCH: 31.2 pg (ref 26.0–34.0)
MCHC: 34.9 g/dL (ref 30.0–36.0)
MCV: 89.4 fL (ref 78.0–100.0)
MONO ABS: 0.6 10*3/uL (ref 0.1–1.0)
Monocytes Relative: 10 %
Neutro Abs: 2.7 10*3/uL (ref 1.7–7.7)
Neutrophils Relative %: 47 %
PLATELETS: 237 10*3/uL (ref 150–400)
RBC: 4.45 MIL/uL (ref 3.87–5.11)
RDW: 13.2 % (ref 11.5–15.5)
WBC: 5.7 10*3/uL (ref 4.0–10.5)

## 2016-05-21 LAB — URINALYSIS, ROUTINE W REFLEX MICROSCOPIC
Bilirubin Urine: NEGATIVE
Glucose, UA: NEGATIVE mg/dL
KETONES UR: NEGATIVE mg/dL
LEUKOCYTES UA: NEGATIVE
NITRITE: NEGATIVE
PH: 5.5 (ref 5.0–8.0)
PROTEIN: NEGATIVE mg/dL
Specific Gravity, Urine: 1.025 (ref 1.005–1.030)

## 2016-05-21 LAB — WET PREP, GENITAL
SPERM: NONE SEEN
TRICH WET PREP: NONE SEEN
YEAST WET PREP: NONE SEEN

## 2016-05-21 LAB — URINE MICROSCOPIC-ADD ON

## 2016-05-21 LAB — PREGNANCY, URINE: Preg Test, Ur: NEGATIVE

## 2016-05-21 MED ORDER — KETOROLAC TROMETHAMINE 30 MG/ML IJ SOLN
30.0000 mg | Freq: Once | INTRAMUSCULAR | Status: AC
  Administered 2016-05-21: 30 mg via INTRAVENOUS
  Filled 2016-05-21: qty 1

## 2016-05-21 MED ORDER — HYDROCODONE-ACETAMINOPHEN 5-325 MG PO TABS: 1.0000 | ORAL_TABLET | Freq: Four times a day (QID) | ORAL | Status: DC | PRN

## 2016-05-21 MED ORDER — MORPHINE SULFATE (PF) 4 MG/ML IV SOLN
8.0000 mg | Freq: Once | INTRAVENOUS | Status: AC
  Administered 2016-05-21: 8 mg via INTRAMUSCULAR
  Filled 2016-05-21: qty 2

## 2016-05-21 NOTE — ED Notes (Signed)
Pt c/o "severe menstrual cramps" since 0300 this morning.  Pt took ibuprofen 800mg  at 0230 this morning.  Pt reports episodes of similar pain "every other month" along with heavier than normal bleeding.

## 2016-05-21 NOTE — Discharge Instructions (Signed)
Dysmenorrhea Continue to take ibuprofen 800 mg 3 times daily for menstrual cramps. If pain not well controlled with ibuprofen, take the pain medication prescribed. Contact your gynecologist tomorrow to schedule the next available follow-up appointment. Menstrual cramps (dysmenorrhea) are caused by the muscles of the uterus tightening (contracting) during a menstrual period. For some women, this discomfort is merely bothersome. For others, dysmenorrhea can be severe enough to interfere with everyday activities for a few days each month. Primary dysmenorrhea is menstrual cramps that last a couple of days when you start having menstrual periods or soon after. This often begins after a teenager starts having her period. As a woman gets older or has a baby, the cramps will usually lessen or disappear. Secondary dysmenorrhea begins later in life, lasts longer, and the pain may be stronger than primary dysmenorrhea. The pain may start before the period and last a few days after the period.  CAUSES  Dysmenorrhea is usually caused by an underlying problem, such as:  The tissue lining the uterus grows outside of the uterus in other areas of the body (endometriosis).  The endometrial tissue, which normally lines the uterus, is found in or grows into the muscular walls of the uterus (adenomyosis).  The pelvic blood vessels are engorged with blood just before the menstrual period (pelvic congestive syndrome).  Overgrowth of cells (polyps) in the lining of the uterus or cervix.  Falling down of the uterus (prolapse) because of loose or stretched ligaments.  Depression.  Bladder problems, infection, or inflammation.  Problems with the intestine, a tumor, or irritable bowel syndrome.  Cancer of the female organs or bladder.  A severely tipped uterus.  A very tight opening or closed cervix.  Noncancerous tumors of the uterus (fibroids).  Pelvic inflammatory disease (PID).  Pelvic scarring (adhesions)  from a previous surgery.  Ovarian cyst.  An intrauterine device (IUD) used for birth control. RISK FACTORS You may be at greater risk of dysmenorrhea if:  You are younger than age 51.  You started puberty early.  You have irregular or heavy bleeding.  You have never given birth.  You have a family history of this problem.  You are a smoker. SIGNS AND SYMPTOMS   Cramping or throbbing pain in your lower abdomen.  Headaches.  Lower back pain.  Nausea or vomiting.  Diarrhea.  Sweating or dizziness.  Loose stools. DIAGNOSIS  A diagnosis is based on your history, symptoms, physical exam, diagnostic tests, or procedures. Diagnostic tests or procedures may include:  Blood tests.  Ultrasonography.  An examination of the lining of the uterus (dilation and curettage, D&C).  An examination inside your abdomen or pelvis with a scope (laparoscopy).  X-rays.  CT scan.  MRI.  An examination inside the bladder with a scope (cystoscopy).  An examination inside the intestine or stomach with a scope (colonoscopy, gastroscopy). TREATMENT  Treatment depends on the cause of the dysmenorrhea. Treatment may include:  Pain medicine prescribed by your health care provider.  Birth control pills or an IUD with progesterone hormone in it.  Hormone replacement therapy.  Nonsteroidal anti-inflammatory drugs (NSAIDs). These may help stop the production of prostaglandins.  Surgery to remove adhesions, endometriosis, ovarian cyst, or fibroids.  Removal of the uterus (hysterectomy).  Progesterone shots to stop the menstrual period.  Cutting the nerves on the sacrum that go to the female organs (presacral neurectomy).  Electric current to the sacral nerves (sacral nerve stimulation).  Antidepressant medicine.  Psychiatric therapy, counseling, or group therapy.  Exercise and physical therapy.  Meditation and yoga therapy.  Acupuncture. HOME CARE INSTRUCTIONS   Only  take over-the-counter or prescription medicines as directed by your health care provider.  Place a heating pad or hot water bottle on your lower back or abdomen. Do not sleep with the heating pad.  Use aerobic exercises, walking, swimming, biking, and other exercises to help lessen the cramping.  Massage to the lower back or abdomen may help.  Stop smoking.  Avoid alcohol and caffeine. SEEK MEDICAL CARE IF:   Your pain does not get better with medicine.  You have pain with sexual intercourse.  Your pain increases and is not controlled with medicines.  You have abnormal vaginal bleeding with your period.  You develop nausea or vomiting with your period that is not controlled with medicine. SEEK IMMEDIATE MEDICAL CARE IF:  You pass out.    This information is not intended to replace advice given to you by your health care provider. Make sure you discuss any questions you have with your health care provider.   Document Released: 12/11/2005 Document Revised: 08/13/2013 Document Reviewed: 05/29/2013 Elsevier Interactive Patient Education [employer]2016 [employer] Inc[employer]

## 2016-05-21 NOTE — ED Provider Notes (Signed)
CSN: [id number]     Arrival date & time 05/21/16  1144 History   First MD Initiated Contact with Patient 05/21/16 1200     Chief Complaint  Patient presents with  . Abdominal Pain     (Consider location/radiation/quality/duration/timing/severity/associated sxs/prior Treatment) HPI Complains of severe menstrual cramps onset approximately 3 AM today. Pain is at lower abdomen, crampy in nature and nonradiating Pain is typical menstrual period she gets monthly. She states she's used 7 pads in the past 24 hours. She denies any vomiting denies fever. She does admit to severe menstrual cramps monthly. Treated herself with ibuprofen, without relief. No other associated symptoms. History reviewed. No pertinent past medical history. Past Surgical History  Procedure Laterality Date  . Tubal ligation    . Cesarean section    . Ablasion     No family history on file. Social History  Substance Use Topics  . Smoking status: Never Smoker   . Smokeless tobacco: None  . Alcohol Use: No   OB History    Gravida Para Term Preterm AB TAB SAB Ectopic Multiple Living   Review of Systems  Constitutional: Negative.   HENT: Negative.   Respiratory: Negative.   Cardiovascular: Negative.   Gastrointestinal: Positive for abdominal pain.  Genitourinary: Positive for vaginal bleeding and menstrual problem.  Musculoskeletal: Negative.   Skin: Negative.   Neurological: Negative.   Psychiatric/Behavioral: Negative.   All other systems reviewed and are negative.     Allergies  Review of patient's allergies indicates no known allergies.  Home Medications   Prior to Admission medications   Not on File   BP 105/76 mmHg  Pulse 72  Temp(Src) 98.5 F (36.9 C) (Oral)  Resp 20  Ht  (1.702 m)  Wt 172 lb (78.019 kg)  BMI 26.93 kg/m2  SpO2 100%  LMP 05/20/2016 (Exact Date) Physical Exam  Constitutional: She appears well-developed and well-nourished. She appears  distressed.  Appears uncomfortable  HENT:  Head: Normocephalic and atraumatic.  Eyes: Conjunctivae are normal. Pupils are equal, round, and reactive to light.  Neck: Neck supple. No tracheal deviation present. No thyromegaly present.  Cardiovascular: Normal rate and regular rhythm.   No murmur heard. Pulmonary/Chest: Effort normal and breath sounds normal.  Abdominal: Soft. Bowel sounds are normal. She exhibits no distension. There is no tenderness.  Genitourinary: Vagina normal.  No external lesion. Dark blood in the vaginal vault. Cervical os closed no cervical motion tenderness no adnexal masses or tenderness  Musculoskeletal: Normal range of motion. She exhibits no edema or tenderness.  Neurological: She is alert. Coordination normal.  Skin: Skin is warm and dry. No rash noted.  Psychiatric: She has a normal mood and affect.  Nursing note and vitals reviewed.   ED Course  Procedures (including critical care time) Labs Review Labs Reviewed  PREGNANCY, URINE  URINALYSIS, ROUTINE W REFLEX MICROSCOPIC (NOT AT [facility])    Imaging Review No results found. I have personally reviewed and evaluated these images and lab results as part of my medical decision-making.   EKG Interpretation None     Patient refused IV.S He was administered Toradol 30 mg IM. At 1:55 PM she reports no relief. IM morphine ordered. 2:45 PM pain is improved and she feels ready to go home Results for orders placed or performed during the hospital encounter of 05/21/16  Wet prep, genital  Result Value Ref Range   [employer]  Prep HPF POC NONE SEEN NONE SEEN   Trich, Wet Prep NONE SEEN NONE SEEN   Clue Cells Wet Prep HPF POC PRESENT (A) NONE SEEN   WBC, Wet Prep HPF POC MANY (A) NONE SEEN   Sperm NONE SEEN   Urinalysis, Routine w reflex microscopic (not at [facility])  Result Value Ref Range   Color, Urine YELLOW YELLOW   APPearance CLOUDY (A) CLEAR   Specific Gravity, Urine 1.025 1.005 - 1.030   pH 5.5 5.0 - 8.0    Glucose, UA NEGATIVE NEGATIVE mg/dL   Hgb urine dipstick LARGE (A) NEGATIVE   Bilirubin Urine NEGATIVE NEGATIVE   Ketones, ur NEGATIVE NEGATIVE mg/dL   Protein, ur NEGATIVE NEGATIVE mg/dL   Nitrite NEGATIVE NEGATIVE   Leukocytes, UA NEGATIVE NEGATIVE  Pregnancy, urine  Result Value Ref Range   Preg Test, Ur NEGATIVE NEGATIVE  CBC with Differential/Platelet  Result Value Ref Range   WBC 5.7 4.0 - 10.5 K/uL   RBC 4.45 3.87 - 5.11 MIL/uL   Hemoglobin 13.9 12.0 - 15.0 g/dL   HCT 16.139.8 09.636.0 - 04.546.0 %   MCV 89.4 78.0 - 100.0 fL   MCH 31.2 26.0 - 34.0 pg   MCHC 34.9 30.0 - 36.0 g/dL   RDW 40.913.2 81.111.5 - 91.415.5 %   Platelets 237 150 - 400 K/uL   Neutrophils Relative % 47 %   Neutro Abs 2.7 1.7 - 7.7 K/uL   Lymphocytes Relative 36 %   Lymphs Abs 2.0 0.7 - 4.0 K/uL   Monocytes Relative 10 %   Monocytes Absolute 0.6 0.1 - 1.0 K/uL   Eosinophils Relative 7 %   Eosinophils Absolute 0.4 0.0 - 0.7 K/uL   Basophils Relative 0 %   Basophils Absolute 0.0 0.0 - 0.1 K/uL  Urine microscopic-add on  Result Value Ref Range   Squamous Epithelial / LPF 0-5 (A) NONE SEEN   WBC, UA 0-5 0 - 5 WBC/hpf   RBC / HPF TOO NUMEROUS TO COUNT 0 - 5 RBC/hpf   Bacteria, UA MANY (A) NONE SEEN   No results found.  MDM  Plan GC chlamydia RPR and HIV pending. Prescription Norco. Continue ibuprofen Follow-up with gynecologist Diagnosis dysmenorrhea Final diagnoses:  None        Doug SouSam Kodey Xue, MD 05/21/16 1450

## 2016-05-22 LAB — RPR: RPR Ser Ql: NONREACTIVE

## 2016-05-23 LAB — GC/CHLAMYDIA PROBE AMP (~~LOC~~) NOT AT ARMC
Chlamydia: NEGATIVE
Neisseria Gonorrhea: NEGATIVE

## 2016-05-23 LAB — HIV ANTIBODY (ROUTINE TESTING W REFLEX): HIV Screen 4th Generation wRfx: NONREACTIVE

## 2016-08-19 ENCOUNTER — Emergency Department (HOSPITAL_BASED_OUTPATIENT_CLINIC_OR_DEPARTMENT_OTHER)
Admission: EM | Admit: 2016-08-19 | Discharge: 2016-08-19 | Disposition: A | Discharge status: Home / Self Care | Payer: Medicaid Other | Attending: Emergency Medicine | Admitting: Emergency Medicine

## 2016-08-19 ENCOUNTER — Encounter (HOSPITAL_BASED_OUTPATIENT_CLINIC_OR_DEPARTMENT_OTHER): Payer: Self-pay | Admitting: *Deleted

## 2016-08-19 DIAGNOSIS — L0291 Cutaneous abscess, unspecified: Secondary | ICD-10-CM

## 2016-08-19 DIAGNOSIS — L0231 Cutaneous abscess of buttock: Secondary | ICD-10-CM | POA: Diagnosis not present

## 2016-08-19 MED ORDER — DOXYCYCLINE HYCLATE 100 MG PO CAPS: 100.0000 mg | ORAL_CAPSULE | Freq: Two times a day (BID) | ORAL | 0 refills | Status: DC

## 2016-08-19 MED ORDER — OXYCODONE-ACETAMINOPHEN 5-325 MG PO TABS
1.0000 | ORAL_TABLET | Freq: Once | ORAL | Status: AC
  Administered 2016-08-19: 1 via ORAL
  Filled 2016-08-19: qty 1

## 2016-08-19 MED ORDER — HYDROCODONE-ACETAMINOPHEN 5-325 MG PO TABS: 1.0000 | ORAL_TABLET | ORAL | 0 refills | Status: DC | PRN

## 2016-08-19 MED ORDER — LIDOCAINE-EPINEPHRINE (PF) 2 %-1:200000 IJ SOLN
INTRAMUSCULAR | Status: AC
  Filled 2016-08-19: qty 20

## 2016-08-19 MED ORDER — LIDOCAINE-EPINEPHRINE 2 %-1:100000 IJ SOLN: 20.0000 mL | Freq: Once | INTRAMUSCULAR | Status: DC

## 2016-08-19 NOTE — ED Triage Notes (Signed)
Pt c/o abscesses x 1 week; pt has painful swollen area to bilateral lower buttocks

## 2016-08-19 NOTE — ED Provider Notes (Signed)
[facility]   CSN: [id number] Arrival date & time: 08/19/16  [id number]     History   Chief Complaint Chief Complaint  Patient presents with  . Abscess    HPI [name] is a 45 y.o. female.  The history is provided by the patient. No language interpreter was used.  Abscess   [name] is a 45 y.o. female who presents to the Emergency Department complaining of abscess.  She reports 1 week of 2 bumps to her buttocks. She reports local pain and heat to these areas. She has tried warm baths and attempting to drain it at home but she has experienced significant worsening in her pain and comes in for evaluation. She denies any fevers, nausea, malaise, pain with bowel movements.  She has a history of abscesses but has never had to come in to the emergency department for them previously.  History reviewed. No pertinent past medical history.  Patient Active Problem List   Diagnosis Date Noted  . 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 ablasion    . CESAREAN SECTION    . TUBAL LIGATION      OB History    Gravida Para Term Preterm AB Living   7 5 4 1 2 5    SAB TAB Ectopic Multiple Live Births   2               Home Medications    Prior to Admission medications   Medication Sig Start Date End Date Taking? Authorizing Provider  doxycycline (VIBRAMYCIN) 100 MG capsule Take 1 capsule (100 mg total) by mouth 2 (two) times daily. 08/19/16   Tilden FossaElizabeth Autym Siess, MD  HYDROcodone-acetaminophen (NORCO/VICODIN) 5-325 MG tablet Take 1 tablet by mouth every 4 (four) hours as needed. 08/19/16   Tilden FossaElizabeth Francille Wittmann, MD    Family History History reviewed. No pertinent family history.  Social History Social History  Substance Use Topics  . Smoking status: Never Smoker  . Smokeless tobacco: Never Used  . Alcohol use No     Allergies   Review of patient's allergies indicates no known  allergies.   Review of Systems Review of Systems  All other systems reviewed and are negative.    Physical Exam Updated Vital Signs BP 118/60 (BP Location: Right Arm)   Pulse 64   Temp 98.2 F (36.8 C) (Oral)   Resp 18   Ht 5\' 7"  (1.702 m)   Wt 172 lb (78 kg)   LMP 08/05/2016   SpO2 100%   BMI 26.94 kg/m   Physical Exam  Constitutional: She is oriented to person, place, and time. She appears well-developed and well-nourished. No distress.  HENT:  Head: Normocephalic and atraumatic.  Cardiovascular: Normal rate and regular rhythm.   Pulmonary/Chest: Effort normal. No respiratory distress.  Abdominal: Soft.  Genitourinary:  Genitourinary Comments: Left lower gluteal region with 1cm focal area of fluctuance with 3 cm of surrounding induration.  Right lower gluteal region with 1cm of focal fluctuance with 4-5 cm of surrounding induration.  No perianal abscess  Neurological: She is alert and oriented to person, place, and time.  Skin: Skin is warm and dry.  Psychiatric: She has a normal mood and affect. Her behavior is normal.  Nursing note and vitals reviewed.    ED Treatments / Results  Labs (all labs ordered are listed, but only abnormal results are displayed) Labs Reviewed - No data to  display  EKG  EKG Interpretation None       Radiology No results found.  Procedures Procedures (including critical care time) INCISION AND DRAINAGE Performed by: Tilden Fossa Consent: Verbal consent obtained. Risks and benefits: risks, benefits and alternatives were discussed Type: abscess  Body area: left buttocks   Anesthesia: local infiltration  Incision was made with a scalpel.  Local anesthetic: lidocaine 1% with epinephrine  Anesthetic total: 2 ml  Complexity: simple Blunt dissection to break up loculations  Drainage: purulent  Drainage amount: moderate  Packing material: none  Patient tolerance: Patient tolerated the procedure well with no  immediate complications.  INCISION AND DRAINAGE Performed by: Tilden Fossa Consent: Verbal consent obtained. Risks and benefits: risks, benefits and alternatives were discussed Type: abscess  Body area: right buttocks  Anesthesia: local infiltration  Incision was made with a scalpel.  Local anesthetic: lidocaine 1% with epinephrine  Anesthetic total: 2 ml  Complexity: complex Blunt dissection to break up loculations  Drainage: purulent  Drainage amount: moderate  Packing material: none  Patient tolerance: Patient tolerated the procedure well with no immediate complications.      Medications Ordered in ED Medications  lidocaine-EPINEPHrine (XYLOCAINE W/EPI) 2 %-1:100000 (with pres) injection 20 mL (not administered)  lidocaine-EPINEPHrine (XYLOCAINE W/EPI) 2 %-1:200000 (PF) injection (not administered)  oxyCODONE-acetaminophen (PERCOCET/ROXICET) 5-325 MG per tablet 1 tablet (1 tablet Oral Given 08/19/16 0340)     Initial Impression / Assessment and Plan / ED Course  I have reviewed the triage vital signs and the nursing notes.  Pertinent labs & imaging results that were available during my care of the patient were reviewed by me and considered in my medical decision making (see chart for details).  Clinical Course    Patient here for evaluation of abscesses. She has 2 abscesses on her buttocks (left and right). No perianal abscess. There is mild local cellulitis. No systemic symptoms. She was treated with incision of drainage and tolerated the procedure well. Discussed home care for abscess as well as outpatient follow-up and return precautions. She was started on antibiotics due to local cellulitis as well as having multiple abscesses present.  Final Clinical Impressions(s) / ED Diagnoses   Final diagnoses:  Abscess    New Prescriptions Discharge Medication List as of 08/19/2016  4:34 AM    START taking these medications   Details  doxycycline (VIBRAMYCIN)  100 MG capsule Take 1 capsule (100 mg total) by mouth 2 (two) times daily., Starting Sat 08/19/2016, Print         Tilden Fossa, MD 08/19/16 [phone]

## 2016-10-21 ENCOUNTER — Emergency Department (HOSPITAL_BASED_OUTPATIENT_CLINIC_OR_DEPARTMENT_OTHER)
Admission: EM | Admit: 2016-10-21 | Discharge: 2016-10-21 | Disposition: A | Discharge status: Home / Self Care | Payer: Medicaid Other | Attending: Emergency Medicine | Admitting: Emergency Medicine

## 2016-10-21 ENCOUNTER — Encounter (HOSPITAL_BASED_OUTPATIENT_CLINIC_OR_DEPARTMENT_OTHER): Payer: Self-pay | Admitting: Emergency Medicine

## 2016-10-21 DIAGNOSIS — R102 Pelvic and perineal pain: Secondary | ICD-10-CM | POA: Insufficient documentation

## 2016-10-21 DIAGNOSIS — Z79899 Other long term (current) drug therapy: Secondary | ICD-10-CM | POA: Insufficient documentation

## 2016-10-21 DIAGNOSIS — N946 Dysmenorrhea, unspecified: Secondary | ICD-10-CM | POA: Insufficient documentation

## 2016-10-21 DIAGNOSIS — R1084 Generalized abdominal pain: Secondary | ICD-10-CM | POA: Diagnosis present

## 2016-10-21 MED ORDER — KETOROLAC TROMETHAMINE 60 MG/2ML IM SOLN
60.0000 mg | Freq: Once | INTRAMUSCULAR | Status: AC
  Administered 2016-10-21: 60 mg via INTRAMUSCULAR
  Filled 2016-10-21: qty 2

## 2016-10-21 MED ORDER — HYDROMORPHONE HCL 1 MG/ML IJ SOLN
1.0000 mg | Freq: Once | INTRAMUSCULAR | Status: AC
  Administered 2016-10-21: 1 mg via INTRAMUSCULAR
  Filled 2016-10-21: qty 1

## 2016-10-21 MED ORDER — ACETAMINOPHEN 500 MG PO TABS
1000.0000 mg | ORAL_TABLET | Freq: Once | ORAL | Status: AC
  Administered 2016-10-21: 1000 mg via ORAL
  Filled 2016-10-21: qty 2

## 2016-10-21 MED ORDER — DIAZEPAM 5 MG PO TABS
5.0000 mg | ORAL_TABLET | Freq: Once | ORAL | Status: AC
Start: 2016-10-21 — End: 2016-10-21
  Administered 2016-10-21: 5 mg via ORAL
  Filled 2016-10-21: qty 1

## 2016-10-21 NOTE — Discharge Instructions (Signed)
Take 4 over the counter ibuprofen tablets 3 times a day or 2 over-the-counter naproxen tablets twice a day for pain.  Then take the pain medicine if you feel like you need it. Narcotics do not help with the pain, they only make you care about it less.  You can become addicted to this, people may break into your house to steal it.  It will constipate you.  If you drive under the influence of this medicine you can get a DUI.    

## 2016-10-21 NOTE — ED Provider Notes (Signed)
[facility] Provider [facility]   CSN: [id number] Arrival date & time: 10/21/16  0913     History   Chief Complaint Chief Complaint  Patient presents with  . Abdominal Pain    HPI [name] is a 45 y.o. female.  45 yo F with a chief complaint of dysmenorrhea. Patient has had this issue for many years. She is seen multiple OB GYNs for this. She had a ablation as well as trials of oral birth control pills. These do not seem to be working for her. She has recently started seeing an O[facility]. They're doing another trial of oral contraceptive pills. If this does not work she will then have an ICD placed. The patient is frustrated that she has not had any improvement of her symptoms. This came on with her normal timing for her menstrual cycle. Feels the same as his prior events.   The history is provided by the patient and the spouse.  Abdominal Pain   This is a chronic problem. The current episode started less than 1 hour ago. The problem occurs constantly. The problem has not changed since onset.The pain is associated with an unknown factor. The pain is located in the generalized abdominal region. The quality of the pain is cramping. The pain is at a severity of 10/10. The pain is severe. Pertinent negatives include fever, nausea, vomiting, dysuria, headaches, arthralgias and myalgias. Nothing aggravates the symptoms. Nothing relieves the symptoms.    History reviewed. No pertinent past medical history.  Patient Active Problem List   Diagnosis Date Noted  . 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 ablasion    . CESAREAN SECTION    . TUBAL LIGATION      OB History    Gravida Para Term Preterm AB Living   7 5 4 1 2 5    SAB TAB Ectopic Multiple Live Births   2               Home Medications    Prior to Admission medications   Medication Sig Start Date End Date Taking? Authorizing Provider    HYDROcodone-acetaminophen (NORCO/VICODIN) 5-325 MG tablet Take 1 tablet by mouth every 4 (four) hours as needed. 08/19/16  Yes Tilden FossaElizabeth Rees, MD  doxycycline (VIBRAMYCIN) 100 MG capsule Take 1 capsule (100 mg total) by mouth 2 (two) times daily. 08/19/16   Tilden FossaElizabeth Rees, MD    Family History No family history on file.  Social History Social History  Substance Use Topics  . Smoking status: Never Smoker  . Smokeless tobacco: Never Used  . Alcohol use No     Allergies   Review of patient's allergies indicates no known allergies.   Review of Systems Review of Systems  Constitutional: Negative for chills and fever.  HENT: Negative for congestion and rhinorrhea.   Eyes: Negative for redness and visual disturbance.  Respiratory: Negative for shortness of breath and wheezing.   Cardiovascular: Negative for chest pain and palpitations.  Gastrointestinal: Positive for abdominal pain. Negative for nausea and vomiting.  Genitourinary: Positive for pelvic pain. Negative for dysuria and urgency.  Musculoskeletal: Negative for arthralgias and myalgias.  Skin: Negative for pallor and wound.  Neurological: Negative for dizziness and headaches.     Physical Exam Updated Vital Signs BP 146/78 (BP Location: Left Arm)   Pulse 75   Temp 98 F (36.7 C) (Oral)   Resp 20   Ht 5'  7" (1.702 m)   Wt 172 lb (78 kg)   LMP 10/19/2016   SpO2 98%   BMI 26.94 kg/m   Physical Exam  Constitutional: She is oriented to person, place, and time. She appears well-developed and well-nourished. No distress.  HENT:  Head: Normocephalic and atraumatic.  Eyes: EOM are normal. Pupils are equal, round, and reactive to light.  Neck: Normal range of motion. Neck supple.  Cardiovascular: Normal rate and regular rhythm.  Exam reveals no gallop and no friction rub.   No murmur heard. Pulmonary/Chest: Effort normal. She has no wheezes. She has no rales.  Abdominal: Soft. She exhibits no distension and no mass.  There is tenderness (mild diffuse, lower). There is no guarding.  Musculoskeletal: She exhibits no edema or tenderness.  Neurological: She is alert and oriented to person, place, and time.  Skin: Skin is warm and dry. She is not diaphoretic.  Psychiatric: She has a normal mood and affect. Her behavior is normal.  Nursing note and vitals reviewed.    ED Treatments / Results  Labs (all labs ordered are listed, but only abnormal results are displayed) Labs Reviewed - No data to display  EKG  EKG Interpretation None       Radiology No results found.  Procedures Procedures (including critical care time)  Medications Ordered in ED Medications  ketorolac (TORADOL) injection 60 mg (60 mg Intramuscular Given 10/21/16 0941)  diazepam (VALIUM) tablet 5 mg (5 mg Oral Given 10/21/16 0941)  HYDROmorphone (DILAUDID) injection 1 mg (1 mg Intramuscular Given 10/21/16 0941)  acetaminophen (TYLENOL) tablet 1,000 mg (1,000 mg Oral Given 10/21/16 0940)     Initial Impression / Assessment and Plan / ED Course  I have reviewed the triage vital signs and the nursing notes.  Pertinent labs & imaging results that were available during my care of the patient were reviewed by me and considered in my medical decision making (see chart for details).  Clinical Course    45 yo F With a chief complaint of dysmenorrhea. His ongoing issue for this patient. I will treat her pain. Have her follow-up with GYN. I see no reason for laboratory evaluation at this time.  9:51 AM:  I have discussed the diagnosis/risks/treatment options with the patient and family and believe the pt to be eligible for discharge home to follow-up with PCP. We also discussed returning to the ED immediately if new or worsening sx occur. We discussed the sx which are most concerning (e.g., sudden worsening pain, fever, inability to tolerate by mouth) that necessitate immediate return. Medications administered to the patient during  their visit and any new prescriptions provided to the patient are listed below.  Medications given during this visit Medications  ketorolac (TORADOL) injection 60 mg (60 mg Intramuscular Given 10/21/16 0941)  diazepam (VALIUM) tablet 5 mg (5 mg Oral Given 10/21/16 0941)  HYDROmorphone (DILAUDID) injection 1 mg (1 mg Intramuscular Given 10/21/16 0941)  acetaminophen (TYLENOL) tablet 1,000 mg (1,000 mg Oral Given 10/21/16 0940)     The patient appears reasonably screen and/or stabilized for discharge and I doubt any other medical condition or other [facility] requiring further screening, evaluation, or treatment in the ED at this time prior to discharge.   Final Clinical Impressions(s) / ED Diagnoses   Final diagnoses:  Dysmenorrhea    New Prescriptions New Prescriptions   No medications on file     Melene PlanDan Dixie Jafri, DO 10/21/16 [id number]

## 2016-10-21 NOTE — ED Triage Notes (Signed)
Pt reports menstrual cramps since early this morning. Pt states she has to come in every month for this. She advises she has an ob/gyn who gave her muscle relaxers and percocet which is not working. Denies N/V

## 2016-10-22 ENCOUNTER — Encounter (HOSPITAL_BASED_OUTPATIENT_CLINIC_OR_DEPARTMENT_OTHER): Payer: Self-pay | Admitting: Emergency Medicine

## 2016-10-22 ENCOUNTER — Emergency Department (HOSPITAL_BASED_OUTPATIENT_CLINIC_OR_DEPARTMENT_OTHER)
Admission: EM | Admit: 2016-10-22 | Discharge: 2016-10-22 | Disposition: A | Discharge status: Home / Self Care | Payer: Medicaid Other | Attending: Emergency Medicine | Admitting: Emergency Medicine

## 2016-10-22 DIAGNOSIS — N946 Dysmenorrhea, unspecified: Secondary | ICD-10-CM | POA: Insufficient documentation

## 2016-10-22 DIAGNOSIS — R1030 Lower abdominal pain, unspecified: Secondary | ICD-10-CM | POA: Diagnosis present

## 2016-10-22 LAB — PREGNANCY, URINE: PREG TEST UR: NEGATIVE

## 2016-10-22 MED ORDER — MORPHINE SULFATE (PF) 4 MG/ML IV SOLN
4.0000 mg | Freq: Once | INTRAVENOUS | Status: AC
  Administered 2016-10-22: 4 mg via INTRAMUSCULAR
  Filled 2016-10-22: qty 1

## 2016-10-22 MED ORDER — KETOROLAC TROMETHAMINE 60 MG/2ML IM SOLN
60.0000 mg | Freq: Once | INTRAMUSCULAR | Status: AC
  Administered 2016-10-22: 60 mg via INTRAMUSCULAR
  Filled 2016-10-22: qty 2

## 2016-10-22 NOTE — Discharge Instructions (Signed)
Read the information below.  You may return to the Emergency Department at any time for worsening condition or any new symptoms that concern you.  If you develop high fevers, worsening abdominal pain, uncontrolled vomiting, or are unable to tolerate fluids by mouth, return to the ER for a recheck.   °

## 2016-10-22 NOTE — ED Triage Notes (Signed)
Pt here yesterday for menstrual cramps, given pain meds and released. Pt states the medication helped her until it wore off and is back with ongoing abd cramping.

## 2016-10-22 NOTE — ED Provider Notes (Signed)
[facility]   CSN: [id number] Arrival date & time: 10/22/16  1119     History   Chief Complaint Chief Complaint  Patient presents with  . Abdominal Pain    HPI [name] is a 45 y.o. female.  HPI   Pt with hx dymenorrhea presents today with same.  Was seen yesterday in ED, the day her period started, with uncontrolled suprapubic pain and cramping that feels like labor.  This has been an ongoing problem every month for as long as patient can remember.  Has seen several OBGYNs, has had ablasion, trials of different birth control pills.  Associated vomiting this morning.  No symptoms or changes different than her normal dysmenorrhea symptoms.  Denies fevers, urinary or bowel symptoms, abnormal vaginal discharge.   History reviewed. No pertinent past medical history.  Patient Active Problem List   Diagnosis Date Noted  . 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 ablasion    . CESAREAN SECTION    . TUBAL LIGATION      OB History    Gravida Para Term Preterm AB Living   7 5 4 1 2 5    SAB TAB Ectopic Multiple Live Births   2               Home Medications    Prior to Admission medications   Medication Sig Start Date End Date Taking? Authorizing Provider  doxycycline (VIBRAMYCIN) 100 MG capsule Take 1 capsule (100 mg total) by mouth 2 (two) times daily. 08/19/16   Tilden FossaElizabeth Rees, MD  HYDROcodone-acetaminophen (NORCO/VICODIN) 5-325 MG tablet Take 1 tablet by mouth every 4 (four) hours as needed. 08/19/16   Tilden FossaElizabeth Rees, MD    Family History No family history on file.  Social History Social History  Substance Use Topics  . Smoking status: Never Smoker  . Smokeless tobacco: Never Used  . Alcohol use No     Allergies   Review of patient's allergies indicates no known allergies.   Review of Systems Review of Systems  All other systems reviewed and are  negative.    Physical Exam Updated Vital Signs BP (!) 102/45 (BP Location: Left Arm)   Pulse 62   Temp 98.7 F (37.1 C) (Oral)   Resp 20   Ht 5\' 7"  (1.702 m)   Wt 78 kg   LMP 10/19/2016   SpO2 98%   BMI 26.94 kg/m   Physical Exam  Constitutional: She appears well-developed and well-nourished. No distress.  HENT:  Head: Normocephalic and atraumatic.  Neck: Neck supple.  Cardiovascular: Normal rate and regular rhythm.   Pulmonary/Chest: Effort normal and breath sounds normal. No respiratory distress. She has no wheezes. She has no rales.  Abdominal: Soft. She exhibits no distension. There is tenderness (suprapubic ). There is no rebound and no guarding.  Neurological: She is alert.  Skin: She is not diaphoretic.  Nursing note and vitals reviewed.    ED Treatments / Results  Labs (all labs ordered are listed, but only abnormal results are displayed) Labs Reviewed  PREGNANCY, URINE    EKG  EKG Interpretation None       Radiology No results found.  Procedures Procedures (including critical care time)  Medications Ordered in ED Medications  ketorolac (TORADOL) injection 60 mg (60 mg Intramuscular Given 10/22/16 1329)  morphine 4 MG/ML injection 4 mg (4 mg Intramuscular Given 10/22/16 1331)     Initial  Impression / Assessment and Plan / ED Course  I have reviewed the triage vital signs and the nursing notes.  Pertinent labs & imaging results that were available during my care of the patient were reviewed by me and considered in my medical decision making (see chart for details).  Clinical Course   Afebrile, nontoxic patient with typical symptoms of dysmenorrhea.  Pregnancy test negative.    D/C home GYN follow up.  Discussed result, findings, treatment, and follow up  with patient.  Pt given return precautions.  Pt verbalizes understanding and agrees with plan.       Final Clinical Impressions(s) / ED Diagnoses   Final diagnoses:  Dysmenorrhea    New  Prescriptions Discharge Medication List as of 10/22/2016  2:14 PM       Trixie Dredgemily Makalia Bare, PA-C 10/22/16 1611    Canary Brimhristopher J Tegeler, MD 10/23/16 [phone]

## 2019-05-11 ENCOUNTER — Other Ambulatory Visit: Payer: Self-pay

## 2019-05-11 ENCOUNTER — Emergency Department (HOSPITAL_BASED_OUTPATIENT_CLINIC_OR_DEPARTMENT_OTHER)
Admission: EM | Admit: 2019-05-11 | Discharge: 2019-05-11 | Disposition: A | Discharge status: Home / Self Care | Payer: Medicaid Other | Attending: Emergency Medicine | Admitting: Emergency Medicine

## 2019-05-11 ENCOUNTER — Encounter (HOSPITAL_BASED_OUTPATIENT_CLINIC_OR_DEPARTMENT_OTHER): Payer: Self-pay | Admitting: Emergency Medicine

## 2019-05-11 DIAGNOSIS — M546 Pain in thoracic spine: Secondary | ICD-10-CM | POA: Diagnosis present

## 2019-05-11 DIAGNOSIS — M542 Cervicalgia: Secondary | ICD-10-CM

## 2019-05-11 MED ORDER — CYCLOBENZAPRINE HCL 10 MG PO TABS: 10.0000 mg | ORAL_TABLET | Freq: Two times a day (BID) | ORAL | 0 refills | Status: DC | PRN

## 2019-05-11 MED ORDER — CYCLOBENZAPRINE HCL 10 MG PO TABS
10.0000 mg | ORAL_TABLET | Freq: Once | ORAL | Status: AC
  Administered 2019-05-11: 13:00:00 10 mg via ORAL
  Filled 2019-05-11: qty 1

## 2019-05-11 MED ORDER — KETOROLAC TROMETHAMINE 15 MG/ML IJ SOLN
15.0000 mg | Freq: Once | INTRAMUSCULAR | Status: AC
  Administered 2019-05-11: 15 mg via INTRAVENOUS
  Filled 2019-05-11: qty 1

## 2019-05-11 NOTE — ED Provider Notes (Signed)
[facility] EMERGENCY DEPARTMENT Provider Note   CSN: [id number] Arrival date & time: 05/11/19  1202    History   Chief Complaint Chief Complaint  Patient presents with  . Neck Pain    HPI [name] is a 48 y.o. female.      Neck Pain  Pain location:  L side Quality:  Stiffness Pain radiates to:  Does not radiate Pain severity:  Mild Onset quality:  Gradual Timing:  Intermittent Progression:  Waxing and waning Context: not recent injury   Relieved by:  Nothing Worsened by:  Nothing Ineffective treatments:  None tried Associated symptoms: no chest pain, no fever, no headaches and no numbness     History reviewed. No pertinent past medical history.  Patient Active Problem List   Diagnosis Date Noted  . 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 ABDOMINAL HYSTERECTOMY    . ablasion    . CESAREAN SECTION    . TUBAL LIGATION       OB History    Gravida  7   Para  5   Term  4   Preterm  1   AB  2   Living  5     SAB  2   TAB      Ectopic      Multiple      Live Births               Home Medications    Prior to Admission medications   Medication Sig Start Date End Date Taking? Authorizing Provider  cyclobenzaprine (FLEXERIL) 10 MG tablet Take 1 tablet (10 mg total) by mouth 2 (two) times daily as needed for up to 20 doses for muscle spasms. 05/11/19   Shahan Starks, DO  doxycycline (VIBRAMYCIN) 100 MG capsule Take 1 capsule (100 mg total) by mouth 2 (two) times daily. 08/19/16   Tilden Fossa, MD  HYDROcodone-acetaminophen (NORCO/VICODIN) 5-325 MG tablet Take 1 tablet by mouth every 4 (four) hours as needed. 08/19/16   Tilden Fossa, MD    Family History No family history on file.  Social History Social History   Tobacco Use  . Smoking status: Never Smoker  . Smokeless tobacco: Never Used  Substance Use Topics  . Alcohol use: No  . Drug use: No      Allergies   Patient has no known allergies.   Review of Systems Review of Systems  Constitutional: Negative for chills and fever.  HENT: Negative for ear pain and sore throat.   Eyes: Negative for pain and visual disturbance.  Respiratory: Negative for cough and shortness of breath.   Cardiovascular: Negative for chest pain and palpitations.  Gastrointestinal: Negative for abdominal pain and vomiting.  Genitourinary: Negative for dysuria and hematuria.  Musculoskeletal: Positive for neck stiffness. Negative for arthralgias, back pain, gait problem, joint swelling, myalgias and neck pain.  Skin: Negative for color change and rash.  Neurological: Negative for dizziness, seizures, syncope, facial asymmetry, speech difficulty, light-headedness, numbness and headaches.  All other systems reviewed and are negative.    Physical Exam Updated Vital Signs  ED Triage Vitals  Enc Vitals Group     BP 05/11/19 1212 (!) 112/54     Pulse Rate 05/11/19 1212 67     Resp 05/11/19 1212 18     Temp 05/11/19 1212 98.2 F (36.8 C)     Temp Source 05/11/19 1212 Oral  SpO2 05/11/19 1212 98 %     Weight 05/11/19 1210 172 lb (78 kg)     Height 05/11/19 1210 5\' 7"  (1.702 m)     Head Circumference --      Peak Flow --      Pain Score 05/11/19 1210 9     Pain Loc --      Pain Edu? --      Excl. in GC? --     Physical Exam Vitals signs and nursing note reviewed.  Constitutional:      General: She is not in acute distress.    Appearance: She is well-developed. She is not ill-appearing.  HENT:     Head: Normocephalic and atraumatic.     Nose: Nose normal.     Mouth/Throat:     Mouth: Mucous membranes are moist.  Eyes:     Extraocular Movements: Extraocular movements intact.     Conjunctiva/sclera: Conjunctivae normal.     Pupils: Pupils are equal, round, and reactive to light.  Neck:     Musculoskeletal: Normal range of motion and neck supple. Muscular tenderness (TTP to paraspinal  muscles on left) present. No neck rigidity.  Cardiovascular:     Rate and Rhythm: Normal rate and regular rhythm.     Heart sounds: No murmur.  Pulmonary:     Effort: Pulmonary effort is normal. No respiratory distress.     Breath sounds: Normal breath sounds.  Abdominal:     Palpations: Abdomen is soft.     Tenderness: There is no abdominal tenderness.  Musculoskeletal: Normal range of motion.        General: No tenderness.     Comments: No midline spinal tenderness  Skin:    General: Skin is warm and dry.  Neurological:     General: No focal deficit present.     Mental Status: She is alert and oriented to person, place, and time.     Cranial Nerves: No cranial nerve deficit.     Sensory: No sensory deficit.     Motor: No weakness.     Coordination: Coordination normal.     Comments: 5+/5 strength, normal sensation      ED Treatments / Results  Labs (all labs ordered are listed, but only abnormal results are displayed) Labs Reviewed - No data to display  EKG None  Radiology No results found.  Procedures Procedures (including critical care time)  Medications Ordered in ED Medications  ketorolac (TORADOL) 15 MG/ML injection 15 mg (has no administration in time range)  cyclobenzaprine (FLEXERIL) tablet 10 mg (has no administration in time range)     Initial Impression / Assessment and Plan / ED Course  I have reviewed the triage vital signs and the nursing notes.  Pertinent labs & imaging results that were available during my care of the patient were reviewed by me and considered in my medical decision making (see chart for details).     [name] is a 48 year old female who presents to the ED with neck pain.  Patient with normal vitals.  No fever.  Neck stiffness started yesterday.  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  States there has been no specific trauma but does do some repetitive overhead lifting of boxes at work.  Has  normal neurological exam.  No numbness or tingling down the arms.  No midline spinal tenderness.  No concern for cord compression or nerve irritation.  History and physical is consistent  with a muscle spasm.  Patient given Toradol shot and Flexeril while in the ED.  Given prescription for Flexeril.  Recommend Motrin and Tylenol at home.  Educated about use of Flexeril.  Written for light duty and discharged from the ED in good condition.  Given return precautions.  This chart was dictated using voice recognition software.  Despite best efforts to proofread,  errors can occur which can change the documentation meaning.   Final Clinical Impressions(s) / ED Diagnoses   Final diagnoses:  Neck pain    ED Discharge Orders         Ordered    cyclobenzaprine (FLEXERIL) 10 MG tablet  2 times daily PRN     05/11/19 1220           Alan Riles, DO 05/11/19 1220

## 2019-05-11 NOTE — ED Triage Notes (Signed)
Neck pain since yesterday. It hurts more to turn left.

## 2019-05-11 NOTE — Discharge Instructions (Addendum)
Take 800 mg of Motrin every 8 hours for the next 5 days.  Take Tylenol 650 mg every 6 hours for the next 5 days.  Take Flexeril as needed as discussed.

## 2019-05-16 ENCOUNTER — Encounter (HOSPITAL_BASED_OUTPATIENT_CLINIC_OR_DEPARTMENT_OTHER): Payer: Self-pay

## 2019-05-16 ENCOUNTER — Emergency Department (HOSPITAL_BASED_OUTPATIENT_CLINIC_OR_DEPARTMENT_OTHER): Payer: Medicaid Other

## 2019-05-16 ENCOUNTER — Other Ambulatory Visit: Payer: Self-pay

## 2019-05-16 ENCOUNTER — Emergency Department (HOSPITAL_BASED_OUTPATIENT_CLINIC_OR_DEPARTMENT_OTHER)
Admission: EM | Admit: 2019-05-16 | Discharge: 2019-05-17 | Disposition: A | Discharge status: Home / Self Care | Payer: Medicaid Other | Attending: Emergency Medicine | Admitting: Emergency Medicine

## 2019-05-16 DIAGNOSIS — B349 Viral infection, unspecified: Secondary | ICD-10-CM | POA: Diagnosis not present

## 2019-05-16 DIAGNOSIS — Z20828 Contact with and (suspected) exposure to other viral communicable diseases: Secondary | ICD-10-CM | POA: Insufficient documentation

## 2019-05-16 DIAGNOSIS — R509 Fever, unspecified: Secondary | ICD-10-CM

## 2019-05-16 DIAGNOSIS — M542 Cervicalgia: Secondary | ICD-10-CM | POA: Insufficient documentation

## 2019-05-16 LAB — CBC WITH DIFFERENTIAL/PLATELET
Abs Immature Granulocytes: 0.07 10*3/uL (ref 0.00–0.07)
Basophils Absolute: 0 10*3/uL (ref 0.0–0.1)
Basophils Relative: 0 %
Eosinophils Absolute: 0.1 10*3/uL (ref 0.0–0.5)
Eosinophils Relative: 1 %
HCT: 38.8 % (ref 36.0–46.0)
Hemoglobin: 12.7 g/dL (ref 12.0–15.0)
Immature Granulocytes: 1 %
Lymphocytes Relative: 12 %
Lymphs Abs: 1.8 10*3/uL (ref 0.7–4.0)
MCH: 30.3 pg (ref 26.0–34.0)
MCHC: 32.7 g/dL (ref 30.0–36.0)
MCV: 92.6 fL (ref 80.0–100.0)
Monocytes Absolute: 1.1 10*3/uL — ABNORMAL HIGH (ref 0.1–1.0)
Monocytes Relative: 7 %
Neutro Abs: 12.3 10*3/uL — ABNORMAL HIGH (ref 1.7–7.7)
Neutrophils Relative %: 79 %
Platelets: 281 10*3/uL (ref 150–400)
RBC: 4.19 MIL/uL (ref 3.87–5.11)
RDW: 13 % (ref 11.5–15.5)
WBC: 15.4 10*3/uL — ABNORMAL HIGH (ref 4.0–10.5)
nRBC: 0 % (ref 0.0–0.2)

## 2019-05-16 LAB — URINALYSIS, ROUTINE W REFLEX MICROSCOPIC
Bilirubin Urine: NEGATIVE
Glucose, UA: NEGATIVE mg/dL
Hgb urine dipstick: NEGATIVE
Ketones, ur: 15 mg/dL — AB
Leukocytes,Ua: NEGATIVE
Nitrite: NEGATIVE
Protein, ur: NEGATIVE mg/dL
Specific Gravity, Urine: 1.02 (ref 1.005–1.030)
pH: 8 (ref 5.0–8.0)

## 2019-05-16 LAB — COMPREHENSIVE METABOLIC PANEL
ALT: 35 U/L (ref 0–44)
AST: 31 U/L (ref 15–41)
Albumin: 4.1 g/dL (ref 3.5–5.0)
Alkaline Phosphatase: 56 U/L (ref 38–126)
Anion gap: 8 (ref 5–15)
BUN: 8 mg/dL (ref 6–20)
CO2: 20 mmol/L — ABNORMAL LOW (ref 22–32)
Calcium: 10 mg/dL (ref 8.9–10.3)
Chloride: 108 mmol/L (ref 98–111)
Creatinine, Ser: 0.67 mg/dL (ref 0.44–1.00)
GFR calc Af Amer: >60 mL/min (ref >60)
GFR calc non Af Amer: >60 mL/min (ref >60)
Glucose, Bld: 105 mg/dL — ABNORMAL HIGH (ref 70–99)
Potassium: 4 mmol/L (ref 3.5–5.1)
Sodium: 136 mmol/L (ref 135–145)
Total Bilirubin: 0.6 mg/dL (ref 0.3–1.2)
Total Protein: 7.5 g/dL (ref 6.5–8.1)

## 2019-05-16 LAB — LIPASE, BLOOD: Lipase: 22 U/L (ref 11–51)

## 2019-05-16 MED ORDER — MORPHINE SULFATE (PF) 4 MG/ML IV SOLN
4.0000 mg | Freq: Once | INTRAVENOUS | Status: AC
  Administered 2019-05-16: 21:00:00 4 mg via INTRAVENOUS
  Filled 2019-05-16: qty 1

## 2019-05-16 MED ORDER — OXYCODONE-ACETAMINOPHEN 5-325 MG PO TABS: 1.0000 | ORAL_TABLET | ORAL | 0 refills | Status: DC | PRN

## 2019-05-16 MED ORDER — HYDROMORPHONE HCL 1 MG/ML IJ SOLN
1.0000 mg | Freq: Once | INTRAMUSCULAR | Status: AC
  Administered 2019-05-16: 21:00:00 1 mg via INTRAVENOUS

## 2019-05-16 MED ORDER — HYDROMORPHONE HCL 1 MG/ML IJ SOLN
INTRAMUSCULAR | Status: AC
  Filled 2019-05-16: qty 1

## 2019-05-16 MED ORDER — ACETAMINOPHEN 325 MG PO TABS
650.0000 mg | ORAL_TABLET | Freq: Once | ORAL | Status: AC | PRN
  Administered 2019-05-16: 20:00:00 650 mg via ORAL
  Filled 2019-05-16: qty 2

## 2019-05-16 MED ORDER — OXYCODONE-ACETAMINOPHEN 5-325 MG PO TABS
1.0000 | ORAL_TABLET | Freq: Once | ORAL | Status: AC
  Administered 2019-05-16: 1 via ORAL
  Filled 2019-05-16: qty 1

## 2019-05-16 NOTE — ED Provider Notes (Signed)
[facility] EMERGENCY DEPARTMENT Provider Note   CSN: [id number] Arrival date & time: 05/16/19  1907    History   Chief Complaint Chief Complaint  Patient presents with   Neck Pain   Fever    HPI [name] is a 48 y.o. female.     The history is provided by the patient and medical records. No language interpreter was used.  Fever  Max temp prior to arrival:  200 Temp source:  Oral Severity:  Moderate Onset quality:  Gradual Duration:  1 day Timing:  Constant Progression:  Waxing and waning Chronicity:  New Relieved by:  Nothing Worsened by:  Nothing Ineffective treatments:  None tried Associated symptoms: chills and congestion   Associated symptoms: no chest pain, no confusion, no cough, no diarrhea, no dysuria, no headaches, no myalgias, no nausea, no rash, no rhinorrhea, no sore throat and no vomiting     History reviewed. No pertinent past medical history.  Patient Active Problem List   Diagnosis Date Noted   Cervical stenosis (uterine cervix) 07/07/2015    Past Surgical History:  Procedure Laterality Date   ABDOMINAL HYSTERECTOMY     ablasion     CESAREAN SECTION     TUBAL LIGATION       OB History    Gravida  7   Para  5   Term  4   Preterm  1   AB  2   Living  5     SAB  2   TAB      Ectopic      Multiple      Live Births               Home Medications    Prior to Admission medications   Medication Sig Start Date End Date Taking? Authorizing Provider  cyclobenzaprine (FLEXERIL) 10 MG tablet Take 1 tablet (10 mg total) by mouth 2 (two) times daily as needed for up to 20 doses for muscle spasms. 05/11/19   Curatolo, Adam, DO  doxycycline (VIBRAMYCIN) 100 MG capsule Take 1 capsule (100 mg total) by mouth 2 (two) times daily. 08/19/16   Tilden Fossaees, Elizabeth, MD  HYDROcodone-acetaminophen (NORCO/VICODIN) 5-325 MG tablet Take 1 tablet by mouth every 4 (four) hours as needed. 08/19/16   Tilden Fossaees, Elizabeth, MD     Family History No family history on file.  Social History Social History   Tobacco Use   Smoking status: Never Smoker   Smokeless tobacco: Never Used  Substance Use Topics   Alcohol use: No   Drug use: No     Allergies   Patient has no known allergies.   Review of Systems Review of Systems  Constitutional: Positive for chills and fever. Negative for diaphoresis and fatigue.  HENT: Positive for congestion. Negative for rhinorrhea and sore throat.   Eyes: Negative for photophobia and visual disturbance.  Respiratory: Negative for cough, chest tightness, shortness of breath and wheezing.   Cardiovascular: Negative for chest pain, palpitations and leg swelling.  Gastrointestinal: Negative for abdominal pain, diarrhea, nausea and vomiting.  Genitourinary: Negative for dysuria, flank pain and frequency.  Musculoskeletal: Positive for neck pain. Negative for back pain, myalgias and neck stiffness.  Skin: Negative for rash.  Neurological: Negative for dizziness, tremors, seizures, syncope, facial asymmetry, speech difficulty, weakness, light-headedness, numbness and headaches.  Psychiatric/Behavioral: Negative for confusion.  All other systems reviewed and are negative.    Physical Exam Updated Vital Signs BP 134/70 (BP Location: Right Arm)  Pulse 96    Temp (!) 103.3 F (39.6 C) (Oral)    Resp 20    Ht  (1.702 m)    Wt 80.7 kg    LMP 10/19/2016    SpO2 99%    BMI 27.88 kg/m   Physical Exam Vitals signs and nursing note reviewed.  Constitutional:      General: She is not in acute distress.    Appearance: Normal appearance. She is well-developed. She is obese. She is not ill-appearing, toxic-appearing or diaphoretic.  HENT:     Head: Normocephalic and atraumatic.     Nose: Congestion present. No rhinorrhea.     Mouth/Throat:     Pharynx: No oropharyngeal exudate or posterior oropharyngeal erythema.  Eyes:     Conjunctiva/sclera: Conjunctivae normal.      Pupils: Pupils are equal, round, and reactive to light.  Neck:     Musculoskeletal: Neck supple. Normal range of motion. Muscular tenderness present. No erythema, neck rigidity, crepitus, injury or spinous process tenderness.   Cardiovascular:     Rate and Rhythm: Normal rate and regular rhythm.     Pulses: Normal pulses.     Heart sounds: No murmur.  Pulmonary:     Effort: Pulmonary effort is normal. No respiratory distress.     Breath sounds: Normal breath sounds.  Abdominal:     General: Abdomen is flat. There is no distension.     Palpations: Abdomen is soft.     Tenderness: There is no abdominal tenderness. There is no right CVA tenderness, left CVA tenderness, guarding or rebound.  Musculoskeletal:        General: Tenderness present.     Right lower leg: No edema.     Left lower leg: No edema.  Skin:    General: Skin is warm and dry.     Capillary Refill: Capillary refill takes less than 2 seconds.     Findings: No erythema or rash.  Neurological:     General: No focal deficit present.     Mental Status: She is alert and oriented to person, place, and time.     GCS: GCS eye subscore is 4. GCS verbal subscore is 5. GCS motor subscore is 6.     Cranial Nerves: Cranial nerves are intact. No dysarthria or facial asymmetry.     Sensory: Sensation is intact. No sensory deficit.     Motor: No weakness, tremor, abnormal muscle tone or seizure activity.     Coordination: Coordination normal. Finger-Nose-Finger Test normal.     Gait: Gait normal.     Comments: Patient had no focal neurologic deficits.  Psychiatric:        Mood and Affect: Mood normal.      ED Treatments / Results  Labs (all labs ordered are listed, but only abnormal results are displayed) Labs Reviewed  CBC WITH DIFFERENTIAL/PLATELET - Abnormal; Notable for the following components:      Result Value   WBC 15.4 (*)    Neutro Abs 12.3 (*)    Monocytes Absolute 1.1 (*)    All other components within normal  limits  COMPREHENSIVE METABOLIC PANEL - Abnormal; Notable for the following components:   CO2 20 (*)    Glucose, Bld 105 (*)    All other components within normal limits  URINALYSIS, ROUTINE W REFLEX MICROSCOPIC - Abnormal; Notable for the following components:   APPearance HAZY (*)    Ketones, ur 15 (*)    All other components within normal  limits  URINE CULTURE  NOVEL CORONAVIRUS, NAA (HOSPITAL ORDER, SEND-OUT TO REF LAB)  LIPASE, BLOOD    EKG None  Radiology Dg Chest Portable 1 View  Result Date: 05/16/2019 CLINICAL DATA:  Fever EXAM: PORTABLE CHEST 1 VIEW COMPARISON:  March 10, 2016. FINDINGS: There is no edema or consolidation. Heart size and pulmonary vascularity are normal. No adenopathy. No bone lesions. IMPRESSION: No edema or consolidation. Electronically Signed   By: Bretta Bang III M.D.   On: 05/16/2019 21:16    Procedures Procedures (including critical care time)  Medications Ordered in ED Medications  acetaminophen (TYLENOL) tablet 650 mg (650 mg Oral Given 05/16/19 1936)  morphine 4 MG/ML injection 4 mg (4 mg Intravenous Given 05/16/19 2049)  HYDROmorphone (DILAUDID) injection 1 mg (1 mg Intravenous Given 05/16/19 2128)  oxyCODONE-acetaminophen (PERCOCET/ROXICET) 5-325 MG per tablet 1 tablet (1 tablet Oral Given 05/16/19 2356)     Initial Impression / Assessment and Plan / ED Course  I have reviewed the triage vital signs and the nursing notes.  Pertinent labs & imaging results that were available during my care of the patient were reviewed by me and considered in my medical decision making (see chart for details).        [name] is a 48 y.o. female with a past medical history significant for hysterectomy and recent neck injury who presents with continued left lateral neck pain with new fevers and chills.  Patient reports that last week, she had an injury lifting something at work and pulled her left lateral neck.  She was given muscle relaxant  which has not significant reduce her symptoms.  She reports that she has had continued to work for the last week that has made it worse.  She reports that today she started having fevers and chills and was sent to the emergency department for evaluation.  She denies any headache, nausea, vomiting, vision changes, or neurologic deficits.  She denies any history of meningitis.  She denies any urinary symptoms, GI symptoms, cough, or congestion.  She primarily has a fever.  She denies any rashes or tick exposures.  She denies taking any other medications help her symptoms.  On exam, patient has left lateral neck tenderness and muscle spasm is palpated.  No midline neck tenderness.  No true neck stiffness on my exam.  Patient's lungs were clear and chest was nontender.  Abdomen was nontender.  Patient moving all extremities.    We had a shared decision making about patient's work-up.  Given her lack of headache, midline neck pain, neurologic deficits, vision changes, have low suspicion for meningitis.  Patient does have a fever on arrival.  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  We agreed to get laboratory testing, chest x-ray, and urinalysis initially.  She will be given fluids, pain medicine.  Patient did not want lumbar puncture after this was offered.  Patient's work-up does show leukocytosis.  Urinalysis shows no infection.  Chest x-ray shows no infection.  Lipase not elevated.  Creatinine normal.  Patient's fever improved with Tylenol.  Patient's neck pain nearly resolved with pain medication.  Continue to have low suspicion for meningitis at this time.  Patient will have the coronavirus test sent and patient will be instructed to quarantine and self isolate for the next 7 days.  She was given extremely strict return precautions for new or worsened symptoms including signs of meningitis.  She agreed with  plan of care and had no other  questions or concerns.  Patient was able to eat and drink and was discharged in good condition with improving symptoms.   Final Clinical Impressions(s) / ED Diagnoses   Final diagnoses:  Fever, unspecified fever cause  Viral infection  Neck pain on left side    ED Discharge Orders         Ordered    oxyCODONE-acetaminophen (PERCOCET/ROXICET) 5-325 MG tablet  Every 4 hours PRN     05/16/19 2343          Clinical Impression: 1. Fever, unspecified fever cause   2. Viral infection   3. Neck pain on left side     Disposition: Discharge  Condition: Good  I have discussed the results, Dx and Tx plan with the pt(& family if present). He/she/they expressed understanding and agree(s) with the plan. Discharge instructions discussed at great length. Strict return precautions discussed and pt &/or family have verbalized understanding of the instructions. No further questions at time of discharge.    Discharge Medication List as of 05/16/2019 11:45 PM    START taking these medications   Details  oxyCODONE-acetaminophen (PERCOCET/ROXICET) 5-325 MG tablet Take 1 tablet by mouth every 4 (four) hours as needed for severe pain., Starting Fri 05/16/2019, Normal        Follow Up: [facility] [address] [place] [address] [phone]     [facility] [address] [id number] [address]Q [facility] [place] [address] [phone]       Itzel Mckibbin, Canary Brim, MD 05/17/19 [place] Manners

## 2019-05-16 NOTE — ED Notes (Signed)
ED Provider at bedside. 

## 2019-05-16 NOTE — ED Triage Notes (Signed)
Pt c/o posterior neck pain and left upper back/shoulder pain-was seen here for same 5/17-reports no relief-NAD-steady gait

## 2019-05-16 NOTE — Discharge Instructions (Signed)
Your work-up today was concerning for a viral cause of your fever.  The x-ray did not show pneumonia and your urine did not show infection.  We had the shared decision-making conversation and due to your lack of headache, neurologic deficits, or midline neck pain, we decided together not to pursue lumbar puncture at this time.  We do not think of meningitis.  Your pain is isolated to the left side your neck which worsened after the injury recently.  We sent the coronavirus test, please follow-up with this result.  Please follow-up with your primary doctor.  If any symptoms change or worsen, please return to the nearest emergency department.     Person Under Monitoring Name: [name] [facility]  Location: [address] [place] [address]   Infection Prevention Recommendations for Individuals Confirmed to have, or Being Evaluated for, 2019 Novel Coronavirus (COVID-19) Infection Who Receive Care at Home  Individuals who are confirmed to have, or are being evaluated for, COVID-19 should follow the prevention steps below until a healthcare provider or local or state health department says they can return to normal activities.  Stay home except to get medical care You should restrict activities outside your home, except for getting medical care. Do not go to work, school, or public areas, and do not use public transportation or taxis.  Call ahead before visiting your doctor Before your medical appointment, call the healthcare provider and tell them that you have, or are being evaluated for, COVID-19 infection. This will help the healthcare providers office take steps to keep other people from getting infected. Ask your healthcare provider to call the local or state health department.  Monitor your symptoms Seek prompt medical attention if your illness is worsening (e.g., difficulty breathing). Before going to your medical appointment, call the healthcare provider and tell them that you have,  or are being evaluated for, COVID-19 infection. Ask your healthcare provider to call the local or state health department.  Wear a facemask You should wear a facemask that covers your nose and mouth when you are in the same room with other people and when you visit a healthcare provider. People who live with or visit you should also wear a facemask while they are in the same room with you.  Separate yourself from other people in your home As much as possible, you should stay in a different room from other people in your home. Also, you should use a separate bathroom, if available.  Avoid sharing household items You should not share dishes, drinking glasses, cups, eating utensils, towels, bedding, or other items with other people in your home. After using these items, you should wash them thoroughly with soap and water.  Cover your coughs and sneezes Cover your mouth and nose with a tissue when you cough or sneeze, or you can cough or sneeze into your sleeve. Throw used tissues in a lined trash can, and immediately wash your hands with soap and water for at least 20 seconds or use an alcohol-based hand rub.  Wash your [employer] your hands often and thoroughly with soap and water for at least 20 seconds. You can use an alcohol-based hand sanitizer if soap and water are not available and if your hands are not visibly dirty. Avoid touching your eyes, nose, and mouth with unwashed hands.   Prevention Steps for Caregivers and Household Members of Individuals Confirmed to have, or Being Evaluated for, COVID-19 Infection Being Cared for in the Home  If  you live with, or provide care at home for, a person confirmed to have, or being evaluated for, COVID-19 infection please follow these guidelines to prevent infection:  Follow healthcare providers instructions Make sure that you understand and can help the patient follow any healthcare provider instructions for all care.  Provide for the  patients basic needs You should help the patient with basic needs in the home and provide support for getting groceries, prescriptions, and other personal needs.  Monitor the patients symptoms If they are getting sicker, call his or her medical provider and tell them that the patient has, or is being evaluated for, COVID-19 infection. This will help the healthcare providers office take steps to keep other people from getting infected. Ask the healthcare provider to call the local or state health department.  Limit the number of people who have contact with the patient If possible, have only one caregiver for the patient. Other household members should stay in another home or place of residence. If this is not possible, they should stay in another room, or be separated from the patient as much as possible. Use a separate bathroom, if available. Restrict visitors who do not have an essential need to be in the home.  Keep older adults, very young children, and other sick people away from the patient Keep older adults, very young children, and those who have compromised immune systems or chronic health conditions away from the patient. This includes people with chronic heart, lung, or kidney conditions, diabetes, and cancer.  Ensure good ventilation Make sure that shared spaces in the home have good air flow, such as from an air conditioner or an opened window, weather permitting.  Wash your hands often Wash your hands often and thoroughly with soap and water for at least 20 seconds. You can use an alcohol based hand sanitizer if soap and water are not available and if your hands are not visibly dirty. Avoid touching your eyes, nose, and mouth with unwashed hands. Use disposable paper towels to dry your hands. If not available, use dedicated cloth towels and replace them when they become wet.  Wear a facemask and gloves Wear a disposable facemask at all times in the room and gloves when  you touch or have contact with the patients blood, body fluids, and/or secretions or excretions, such as sweat, saliva, sputum, nasal mucus, vomit, urine, or feces.  Ensure the mask fits over your nose and mouth tightly, and do not touch it during use. Throw out disposable facemasks and gloves after using them. Do not reuse. Wash your hands immediately after removing your facemask and gloves. If your personal clothing becomes contaminated, carefully remove clothing and launder. Wash your hands after handling contaminated clothing. Place all used disposable facemasks, gloves, and other waste in a lined container before disposing them with other household waste. Remove gloves and wash your hands immediately after handling these items.  Do not share dishes, glasses, or other household items with the patient Avoid sharing household items. You should not share dishes, drinking glasses, cups, eating utensils, towels, bedding, or other items with a patient who is confirmed to have, or being evaluated for, COVID-19 infection. After the person uses these items, you should wash them thoroughly with soap and water.  Wash laundry thoroughly Immediately remove and wash clothes or bedding that have blood, body fluids, and/or secretions or excretions, such as sweat, saliva, sputum, nasal mucus, vomit, urine, or feces, on them. Wear gloves when handling laundry from the  patient. Read and follow directions on labels of laundry or clothing items and detergent. In general, wash and dry with the warmest temperatures recommended on the label.  Clean all areas the individual has used often Clean all touchable surfaces, such as counters, tabletops, doorknobs, bathroom fixtures, toilets, phones, keyboards, tablets, and bedside tables, every day. Also, clean any surfaces that may have blood, body fluids, and/or secretions or excretions on them. Wear gloves when cleaning surfaces the patient has come in contact with. Use  a diluted bleach solution (e.g., dilute bleach with 1 part bleach and 10 parts water) or a household disinfectant with a label that says EPA-registered for coronaviruses. To make a bleach solution at home, add 1 tablespoon of bleach to 1 quart (4 cups) of water. For a larger supply, add  cup of bleach to 1 gallon (16 cups) of water. Read labels of cleaning products and follow recommendations provided on product labels. Labels contain instructions for safe and effective use of the cleaning product including precautions you should take when applying the product, such as wearing gloves or eye protection and making sure you have good ventilation during use of the product. Remove gloves and wash hands immediately after cleaning.  Monitor yourself for signs and symptoms of illness Caregivers and household members are considered close contacts, should monitor their health, and will be asked to limit movement outside of the home to the extent possible. Follow the monitoring steps for close contacts listed on the symptom monitoring form.   ? If you have additional questions, contact your local health department or call the epidemiologist on call at [phone] (available 24/7). ? This guidance is subject to change. For the most up-to-date guidance from [facility], please refer to their website: TripMetro.huhttps://www.cdc.gov/coronavirus/2019-ncov/hcp/guidance-prevent-spread.html

## 2019-05-16 NOTE — ED Notes (Signed)
Pt tolerating po's well. States " I feel better".

## 2019-05-16 NOTE — ED Notes (Signed)
Portable xray at bedside.

## 2019-05-18 LAB — URINE CULTURE

## 2019-05-19 LAB — NOVEL CORONAVIRUS, NAA (HOSP ORDER, SEND-OUT TO REF LAB; TAT 18-24 HRS): SARS-CoV-2, NAA: NOT DETECTED

## 2019-11-20 ENCOUNTER — Other Ambulatory Visit: Payer: Self-pay

## 2019-11-20 ENCOUNTER — Encounter (HOSPITAL_BASED_OUTPATIENT_CLINIC_OR_DEPARTMENT_OTHER): Payer: Self-pay | Admitting: Emergency Medicine

## 2019-11-20 ENCOUNTER — Emergency Department (HOSPITAL_BASED_OUTPATIENT_CLINIC_OR_DEPARTMENT_OTHER)
Admission: EM | Admit: 2019-11-20 | Discharge: 2019-11-20 | Disposition: A | Discharge status: Home / Self Care | Payer: Medicaid Other | Attending: Emergency Medicine | Admitting: Emergency Medicine

## 2019-11-20 DIAGNOSIS — L02211 Cutaneous abscess of abdominal wall: Secondary | ICD-10-CM | POA: Diagnosis not present

## 2019-11-20 DIAGNOSIS — L0291 Cutaneous abscess, unspecified: Secondary | ICD-10-CM

## 2019-11-20 MED ORDER — LIDOCAINE-EPINEPHRINE (PF) 2 %-1:200000 IJ SOLN
20.0000 mL | Freq: Once | INTRAMUSCULAR | Status: DC
  Filled 2019-11-20: qty 20

## 2019-11-20 MED ORDER — CEPHALEXIN 500 MG PO CAPS: 500.0000 mg | ORAL_CAPSULE | Freq: Four times a day (QID) | ORAL | 0 refills | Status: AC

## 2019-11-20 NOTE — ED Notes (Signed)
fluctuanct area with macular erythema surrounding it, pt complains of pain.

## 2019-11-20 NOTE — ED Provider Notes (Signed)
[facility]PART[facility] Provider Note   CSN: [id number] Arrival date & time: 11/20/19  2956     History   Chief Complaint Chief Complaint  Patient presents with  . Abscess    HPI [name] is a 48 y.o. female.  She is complaining of 4 to 5 days of a tender bump at her waistline that is been getting progressively more painful and red.  She said it was a small pustule that is continued to worsen.  Pain is 10 out of 10 with palpation.  Tried peroxide without any improvement.  No fevers chills nausea vomiting.  No prior history of abscesses.     The history is provided by the patient.  Abscess Location:  Torso Size:  4 Abscess quality: fluctuance, induration, painful, redness and warmth   Red streaking: yes   Progression:  Worsening Pain details:    Quality:  Throbbing   Severity:  Severe   Timing:  Constant   Progression:  Worsening Relieved by:  Nothing Worsened by:  Draining/squeezing Ineffective treatments:  None tried Associated symptoms: no fatigue, no fever, no nausea and no vomiting   Risk factors: no hx of MRSA and no prior abscess     History reviewed. No pertinent past medical history.  Patient Active Problem List   Diagnosis Date Noted  . 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 ABDOMINAL HYSTERECTOMY    . ablasion    . CESAREAN SECTION    . TUBAL LIGATION       OB History    Gravida  7   Para  5   Term  4   Preterm  1   AB  2   Living  5     SAB  2   TAB      Ectopic      Multiple      Live Births               Home Medications    Prior to Admission medications   Not on File    Family History No family history on file.  Social History Social History   Tobacco Use  . Smoking status: Never Smoker  . Smokeless tobacco: Never Used  Substance Use Topics  . Alcohol use: No  . Drug use: No     Allergies   Patient has no  known allergies.   Review of Systems Review of Systems  Constitutional: Negative for fatigue and fever.  Respiratory: Negative for shortness of breath.   Cardiovascular: Negative for chest pain.  Gastrointestinal: Negative for abdominal pain, nausea and vomiting.  Genitourinary: Negative for dysuria.  Skin: Positive for wound.     Physical Exam Updated Vital Signs BP (!) 124/59 (BP Location: Right Arm)   Pulse 78   Temp 98.8 F (37.1 C) (Oral)   Resp 16   Ht 5\' 7"  (1.702 m)   Wt 78.2 kg   LMP 10/19/2016   SpO2 100%   BMI 26.99 kg/m   Physical Exam Constitutional:      Appearance: She is well-developed.  HENT:     Head: Normocephalic and atraumatic.  Eyes:     Conjunctiva/sclera: Conjunctivae normal.  Neck:     Musculoskeletal: Neck supple.  Cardiovascular:     Rate and Rhythm: Normal rate and regular rhythm.  Pulmonary:     Effort: Pulmonary effort is normal.     Breath sounds:  Normal breath sounds.  Abdominal:     Tenderness: There is abdominal tenderness. There is no guarding or rebound.     Comments: Is approximately 4 cm area of tenderness and induration with some fluctuance just above her mons with surrounding erythema.  Skin:    General: Skin is warm and dry.     Capillary Refill: Capillary refill takes less than 2 seconds.  Neurological:     General: No focal deficit present.     Mental Status: She is alert.     GCS: GCS eye subscore is 4. GCS verbal subscore is 5. GCS motor subscore is 6.      ED Treatments / Results  Labs (all labs ordered are listed, but only abnormal results are displayed) Labs Reviewed - No data to display  EKG None  Radiology No results found.  Procedures .Marland KitchenIncision and Drainage  Date/Time: 11/20/2019 10:07 AM Performed by: Terrilee Files, MD Authorized by: Terrilee Files, MD   Consent:    Consent obtained:  Verbal   Consent given by:  Patient   Risks discussed:  Bleeding, incomplete drainage, pain and  infection   Alternatives discussed:  No treatment, delayed treatment and referral Location:    Type:  Abscess   Size:  4   Location:  Trunk   Trunk location:  Abdomen Pre-procedure details:    Skin preparation:  Betadine Anesthesia (see MAR for exact dosages):    Anesthesia method:  Local infiltration   Local anesthetic:  Lidocaine 2% WITH epi Procedure type:    Complexity:  Simple Procedure details:    Incision types:  Single straight   Scalpel blade:  15   Wound management:  Probed and deloculated   Drainage:  Purulent   Drainage amount:  Moderate   Packing materials:  1/2 in iodoform gauze   Amount 1/2" iodoform:  6 Post-procedure details:    Patient tolerance of procedure:  Tolerated well, no immediate complications   (including critical care time)  Medications Ordered in ED Medications  lidocaine-EPINEPHrine (XYLOCAINE W/EPI) 2 %-1:200000 (PF) injection 20 mL (has no administration in time range)     Initial Impression / Assessment and Plan / ED Course  I have reviewed the triage vital signs and the nursing notes.  Pertinent labs & imaging results that were available during my care of the patient were reviewed by me and considered in my medical decision making (see chart for details).  Clinical Course as of Nov 19 1006  Thu Nov 20, 2019  0957 Differential diagnosis includes abscess, cellulitis, MRSA   [MB]    Clinical Course User Index [MB] Terrilee Files, MD        Final Clinical Impressions(s) / ED Diagnoses   Final diagnoses:  Abscess    ED Discharge Orders         Ordered    cephALEXin (KEFLEX) 500 MG capsule  4 times daily     11/20/19 1008           Terrilee Files, MD 11/20/19 1236

## 2019-11-20 NOTE — Discharge Instructions (Addendum)
You were seen in the emergency department for an abscess on your abdominal wall.  The area was drained and packed with some gauze.  This can be removed in 2 days.  You can use soap and water.  Tylenol or ibuprofen for pain.  Please finish your antibiotics.  Return to the emergency department if any worsening symptoms.

## 2019-11-20 NOTE — ED Triage Notes (Signed)
Abscess to suprapubic area for several days.

## 2023-02-21 ENCOUNTER — Encounter (HOSPITAL_BASED_OUTPATIENT_CLINIC_OR_DEPARTMENT_OTHER): Payer: Self-pay | Admitting: Emergency Medicine

## 2023-02-21 ENCOUNTER — Other Ambulatory Visit: Payer: Self-pay

## 2023-02-21 ENCOUNTER — Emergency Department (HOSPITAL_BASED_OUTPATIENT_CLINIC_OR_DEPARTMENT_OTHER)
Admission: EM | Admit: 2023-02-21 | Discharge: 2023-02-21 | Discharge status: Against Medical Advice | Payer: Commercial Managed Care - HMO | Attending: Emergency Medicine | Admitting: Emergency Medicine

## 2023-02-21 DIAGNOSIS — H5712 Ocular pain, left eye: Secondary | ICD-10-CM | POA: Diagnosis present

## 2023-02-21 DIAGNOSIS — Z5321 Procedure and treatment not carried out due to patient leaving prior to being seen by health care provider: Secondary | ICD-10-CM | POA: Insufficient documentation

## 2023-02-21 NOTE — ED Triage Notes (Signed)
Pt with pain and drainage to LT eye that started today

## 2023-02-22 ENCOUNTER — Other Ambulatory Visit: Payer: Self-pay

## 2023-02-22 ENCOUNTER — Encounter (HOSPITAL_BASED_OUTPATIENT_CLINIC_OR_DEPARTMENT_OTHER): Payer: Self-pay

## 2023-02-22 ENCOUNTER — Emergency Department (HOSPITAL_BASED_OUTPATIENT_CLINIC_OR_DEPARTMENT_OTHER)
Admission: EM | Admit: 2023-02-22 | Discharge: 2023-02-22 | Disposition: A | Discharge status: Home / Self Care | Payer: Commercial Managed Care - HMO | Attending: Emergency Medicine | Admitting: Emergency Medicine

## 2023-02-22 DIAGNOSIS — H00012 Hordeolum externum right lower eyelid: Secondary | ICD-10-CM | POA: Diagnosis not present

## 2023-02-22 DIAGNOSIS — H00019 Hordeolum externum unspecified eye, unspecified eyelid: Secondary | ICD-10-CM

## 2023-02-22 DIAGNOSIS — H00015 Hordeolum externum left lower eyelid: Secondary | ICD-10-CM | POA: Insufficient documentation

## 2023-02-22 MED ORDER — SULFAMETHOXAZOLE-TRIMETHOPRIM 800-160 MG PO TABS: 1.0000 | ORAL_TABLET | Freq: Two times a day (BID) | ORAL | 0 refills | Status: DC

## 2023-02-22 MED ORDER — BACITRACIN ZINC 500 UNIT/GM EX OINT: 1.0000 | TOPICAL_OINTMENT | Freq: Two times a day (BID) | CUTANEOUS | 0 refills | Status: AC

## 2023-02-22 NOTE — ED Provider Notes (Signed)
[facility] Emergency Department Provider Note MRN:  [id number]  Arrival date & time: 02/22/23     Chief Complaint   Eyelid Pain   History of Present Illness   [name] is a 52 y.o. year-old female with no pertinent past medical history presenting to the ED with chief complaint of eye pain.  Bilateral lower eyelid pain.  The left lower lid started first with some pain and localized irritation 2 days ago.  This morning the right lower lid hurts as well.  No fever, no vision loss.  Review of Systems  A thorough review of systems was obtained and all systems are negative except as noted in the HPI and PMH.   Patient's Health History   History reviewed. No pertinent past medical history.  Past Surgical History:  Procedure Laterality Date   ABDOMINAL HYSTERECTOMY     ablasion     CESAREAN SECTION     TUBAL LIGATION      History reviewed. No pertinent family history.  Social History   Socioeconomic History   Marital status: Married    Spouse name: Not on file   Number of children: Not on file   Years of education: Not on file   Highest education level: Not on file  Occupational History   Not on file  Tobacco Use   Smoking status: Never   Smokeless tobacco: Never  Vaping Use   Vaping Use: Never used  Substance and Sexual Activity   Alcohol use: No   Drug use: No   Sexual activity: Yes    Birth control/protection: Surgical  Other Topics Concern   Not on file  Social History Narrative   Not on file   Social Determinants of Health   Financial Resource Strain: Not on file  Food Insecurity: Not on file  Transportation Needs: Not on file  Physical Activity: Not on file  Stress: Not on file  Social Connections: Not on file  Intimate Partner Violence: Not on file     Physical Exam   Vitals:   02/22/23 0508  BP: 122/62  Pulse: (!) 57  Resp: 16  Temp: 98.5 F (36.9 C)  SpO2: 99%    CONSTITUTIONAL: Well-appearing,  NAD NEURO/PSYCH:  Alert and oriented x 3, no focal deficits EYES:  eyes equal and reactive ENT/NECK:  no LAD, no JVD CARDIO: Regular rate, well-perfused, normal S1 and S2 PULM:  CTAB no wheezing or rhonchi GI/GU:  non-distended, non-tender MSK/SPINE:  No gross deformities, no edema SKIN:  no rash, atraumatic   *Additional and/or pertinent findings included in MDM below  Diagnostic and Interventional Summary    EKG Interpretation  Date/Time:    Ventricular Rate:    PR Interval:    QRS Duration:   QT Interval:    QTC Calculation:   R Axis:     Text Interpretation:         Labs Reviewed - No data to display  No orders to display    Medications - No data to display   Procedures  /  Critical Care Procedures  ED Course and Medical Decision Making  Initial Impression and Ddx Exam seems consistent with lower eyelid stye.  A bit odd that she has 2 separate styes on the left lower lid and it appears she has another stye forming on the right lower lid.  Denies any recent new make-ups or artificial eyelashes or facial creams.  No vision loss or signs of ocular emergency, no  other complaints.  Past medical/surgical history that increases complexity of ED encounter: None  Interpretation of Diagnostics Laboratory and/or imaging options to aid in the diagnosis/care of the patient were considered.  After careful history and physical examination, it was determined that there was no indication for diagnostics at this time.  Patient Reassessment and Ultimate Disposition/Management     Discharge  Patient management required discussion with the following services or consulting groups:  None  Complexity of Problems Addressed Acute complicated illness or Injury  Additional Data Reviewed and Analyzed Further history obtained from: None  Additional Factors Impacting ED Encounter Risk Prescriptions  Barth Kirks. Sedonia Small, [place] [email or web address]  Final Clinical Impressions(s) / ED Diagnoses     ICD-10-CM   1. Hordeolum externum, unspecified laterality  H00.019       ED Discharge Orders          Ordered    sulfamethoxazole-trimethoprim (BACTRIM DS) 800-160 MG tablet  2 times daily,   Status:  Discontinued        02/22/23 0529    bacitracin ointment  2 times daily        02/22/23 0530             Discharge Instructions Discussed with and Provided to Patient:    Discharge Instructions      You were evaluated in the Emergency Department and after careful evaluation, we did not find any emergent condition requiring admission or further testing in the hospital.  Your exam/testing today was overall reassuring.  Symptoms seem to be due to a stye.  Use the bacitracin ointment twice daily on the affected areas for the next week.  Use baby shampoo daily on the eyelashes to prevent styes in the future.  Recommend follow-up with your eye doctor if not improving over the next few days.  Please return to the Emergency Department if you experience any worsening of your condition.  Thank you for allowing [place] to be a part of your care.       Maudie Flakes, MD 02/22/23 [phone]

## 2023-02-22 NOTE — ED Triage Notes (Signed)
Pt reports bilateral lower eyelid swelling/pain x2 days. Pt has been using visine allergy and cold washcloths with no improvement.

## 2023-02-22 NOTE — Discharge Instructions (Signed)
You were evaluated in the Emergency Department and after careful evaluation, we did not find any emergent condition requiring admission or further testing in the hospital.  Your exam/testing today was overall reassuring.  Symptoms seem to be due to a stye.  Use the bacitracin ointment twice daily on the affected areas for the next week.  Use baby shampoo daily on the eyelashes to prevent styes in the future.  Recommend follow-up with your eye doctor if not improving over the next few days.  Please return to the Emergency Department if you experience any worsening of your condition.  Thank you for allowing [place] to be a part of your care.
